# Patient Record
Sex: Female | Born: 1970 | Race: Black or African American | Hispanic: No | Marital: Single | Smoking: Never smoker
Health system: Southern US, Community
[De-identification: ages and names within clinical notes are randomized; demographics above are authoritative.]

## PROBLEM LIST (undated history)

## (undated) ENCOUNTER — Emergency Department (HOSPITAL_COMMUNITY): Admission: EM | Source: Home / Self Care

## (undated) DIAGNOSIS — G40909 Epilepsy, unspecified, not intractable, without status epilepticus: Secondary | ICD-10-CM

## (undated) HISTORY — DX: Epilepsy, unspecified, not intractable, without status epilepticus: G40.909

---

## 2004-10-12 ENCOUNTER — Ambulatory Visit: Payer: Self-pay | Admitting: *Deleted

## 2005-04-19 ENCOUNTER — Other Ambulatory Visit: Admission: RE | Admit: 2005-04-19 | Discharge: 2005-04-19 | Payer: Self-pay | Admitting: Obstetrics and Gynecology

## 2006-02-26 ENCOUNTER — Ambulatory Visit: Payer: Self-pay | Admitting: Internal Medicine

## 2006-04-04 ENCOUNTER — Ambulatory Visit: Payer: Self-pay | Admitting: Internal Medicine

## 2006-06-18 ENCOUNTER — Encounter (INDEPENDENT_AMBULATORY_CARE_PROVIDER_SITE_OTHER): Payer: Self-pay | Admitting: Internal Medicine

## 2006-06-18 ENCOUNTER — Ambulatory Visit: Payer: Self-pay | Admitting: Internal Medicine

## 2006-12-11 ENCOUNTER — Encounter (INDEPENDENT_AMBULATORY_CARE_PROVIDER_SITE_OTHER): Payer: Self-pay | Admitting: *Deleted

## 2006-12-23 ENCOUNTER — Telehealth (INDEPENDENT_AMBULATORY_CARE_PROVIDER_SITE_OTHER): Payer: Self-pay | Admitting: Nurse Practitioner

## 2007-01-30 ENCOUNTER — Encounter (INDEPENDENT_AMBULATORY_CARE_PROVIDER_SITE_OTHER): Payer: Self-pay | Admitting: Internal Medicine

## 2007-02-01 ENCOUNTER — Emergency Department (HOSPITAL_COMMUNITY): Admission: EM | Admit: 2007-02-01 | Discharge: 2007-02-01 | Payer: Self-pay | Admitting: Emergency Medicine

## 2007-03-14 ENCOUNTER — Ambulatory Visit: Payer: Self-pay | Admitting: Internal Medicine

## 2007-03-14 DIAGNOSIS — K5900 Constipation, unspecified: Secondary | ICD-10-CM | POA: Insufficient documentation

## 2007-03-14 DIAGNOSIS — K649 Unspecified hemorrhoids: Secondary | ICD-10-CM | POA: Insufficient documentation

## 2007-03-14 DIAGNOSIS — R569 Unspecified convulsions: Secondary | ICD-10-CM | POA: Insufficient documentation

## 2007-03-14 DIAGNOSIS — N926 Irregular menstruation, unspecified: Secondary | ICD-10-CM | POA: Insufficient documentation

## 2007-03-14 DIAGNOSIS — N939 Abnormal uterine and vaginal bleeding, unspecified: Secondary | ICD-10-CM

## 2007-03-15 ENCOUNTER — Encounter (INDEPENDENT_AMBULATORY_CARE_PROVIDER_SITE_OTHER): Payer: Self-pay | Admitting: Internal Medicine

## 2007-03-16 ENCOUNTER — Encounter (INDEPENDENT_AMBULATORY_CARE_PROVIDER_SITE_OTHER): Payer: Self-pay | Admitting: Internal Medicine

## 2007-06-03 ENCOUNTER — Ambulatory Visit: Payer: Self-pay | Admitting: Internal Medicine

## 2007-06-03 DIAGNOSIS — M719 Bursopathy, unspecified: Secondary | ICD-10-CM

## 2007-06-03 DIAGNOSIS — M67919 Unspecified disorder of synovium and tendon, unspecified shoulder: Secondary | ICD-10-CM | POA: Insufficient documentation

## 2007-06-03 LAB — CONVERTED CEMR LAB: Beta hcg, urine, semiquantitative: POSITIVE

## 2007-06-15 ENCOUNTER — Inpatient Hospital Stay (HOSPITAL_COMMUNITY): Admission: AD | Admit: 2007-06-15 | Discharge: 2007-06-15 | Payer: Self-pay | Admitting: Obstetrics & Gynecology

## 2007-07-19 ENCOUNTER — Inpatient Hospital Stay (HOSPITAL_COMMUNITY): Admission: AD | Admit: 2007-07-19 | Discharge: 2007-07-19 | Payer: Self-pay | Admitting: Obstetrics and Gynecology

## 2007-09-16 ENCOUNTER — Ambulatory Visit: Payer: Self-pay | Admitting: Internal Medicine

## 2007-09-16 ENCOUNTER — Telehealth (INDEPENDENT_AMBULATORY_CARE_PROVIDER_SITE_OTHER): Payer: Self-pay | Admitting: Internal Medicine

## 2007-09-16 DIAGNOSIS — K602 Anal fissure, unspecified: Secondary | ICD-10-CM | POA: Insufficient documentation

## 2007-09-16 DIAGNOSIS — M545 Low back pain, unspecified: Secondary | ICD-10-CM | POA: Insufficient documentation

## 2007-10-23 ENCOUNTER — Encounter (INDEPENDENT_AMBULATORY_CARE_PROVIDER_SITE_OTHER): Payer: Self-pay | Admitting: Internal Medicine

## 2008-07-16 ENCOUNTER — Ambulatory Visit: Payer: Self-pay | Admitting: Internal Medicine

## 2008-07-16 ENCOUNTER — Encounter (INDEPENDENT_AMBULATORY_CARE_PROVIDER_SITE_OTHER): Payer: Self-pay | Admitting: Internal Medicine

## 2008-07-16 DIAGNOSIS — A5901 Trichomonal vulvovaginitis: Secondary | ICD-10-CM | POA: Insufficient documentation

## 2008-07-16 DIAGNOSIS — M25569 Pain in unspecified knee: Secondary | ICD-10-CM | POA: Insufficient documentation

## 2008-07-16 LAB — CONVERTED CEMR LAB
Beta hcg, urine, semiquantitative: NEGATIVE
Bilirubin Urine: NEGATIVE
Blood in Urine, dipstick: NEGATIVE
Glucose, Urine, Semiquant: NEGATIVE
KOH Prep: NEGATIVE
Ketones, urine, test strip: NEGATIVE
Protein, U semiquant: NEGATIVE
WBC Urine, dipstick: NEGATIVE
Whiff Test: POSITIVE

## 2008-07-19 LAB — CONVERTED CEMR LAB
ALT: 18 units/L (ref 0–35)
AST: 18 units/L (ref 0–37)
Albumin: 4.3 g/dL (ref 3.5–5.2)
Alkaline Phosphatase: 57 units/L (ref 39–117)
BUN: 13 mg/dL (ref 6–23)
Basophils Absolute: 0 10*3/uL (ref 0.0–0.1)
Carbamazepine Lvl: 0.3 ug/mL — ABNORMAL LOW (ref 4.0–12.0)
Chlamydia, DNA Probe: NEGATIVE
Chloride: 102 meq/L (ref 96–112)
Eosinophils Relative: 1 % (ref 0–5)
GC Probe Amp, Genital: NEGATIVE
HCT: 39 % (ref 36.0–46.0)
HDL: 59 mg/dL (ref >39)
LDL Cholesterol: 110 mg/dL — ABNORMAL HIGH (ref 0–99)
Lymphocytes Relative: 39 % (ref 12–46)
Lymphs Abs: 2.6 10*3/uL (ref 0.7–4.0)
Neutro Abs: 3.4 10*3/uL (ref 1.7–7.7)
Neutrophils Relative %: 51 % (ref 43–77)
Platelets: 233 10*3/uL (ref 150–400)
Potassium: 4.7 meq/L (ref 3.5–5.3)
RDW: 14.8 % (ref 11.5–15.5)
Total CHOL/HDL Ratio: 3.2
WBC: 6.6 10*3/uL (ref 4.0–10.5)

## 2008-07-27 ENCOUNTER — Ambulatory Visit: Payer: Self-pay | Admitting: Internal Medicine

## 2008-08-02 ENCOUNTER — Ambulatory Visit: Payer: Self-pay | Admitting: Internal Medicine

## 2008-08-13 ENCOUNTER — Emergency Department (HOSPITAL_COMMUNITY): Admission: EM | Admit: 2008-08-13 | Discharge: 2008-08-13 | Payer: Self-pay | Admitting: Emergency Medicine

## 2008-08-16 ENCOUNTER — Telehealth (INDEPENDENT_AMBULATORY_CARE_PROVIDER_SITE_OTHER): Payer: Self-pay | Admitting: Internal Medicine

## 2008-09-13 ENCOUNTER — Ambulatory Visit: Payer: Self-pay | Admitting: Internal Medicine

## 2008-09-14 ENCOUNTER — Telehealth (INDEPENDENT_AMBULATORY_CARE_PROVIDER_SITE_OTHER): Payer: Self-pay | Admitting: Internal Medicine

## 2008-09-20 ENCOUNTER — Telehealth (INDEPENDENT_AMBULATORY_CARE_PROVIDER_SITE_OTHER): Payer: Self-pay | Admitting: Internal Medicine

## 2008-09-28 ENCOUNTER — Telehealth (INDEPENDENT_AMBULATORY_CARE_PROVIDER_SITE_OTHER): Payer: Self-pay | Admitting: Internal Medicine

## 2008-10-01 ENCOUNTER — Ambulatory Visit: Payer: Self-pay | Admitting: Physician Assistant

## 2009-03-28 ENCOUNTER — Telehealth (INDEPENDENT_AMBULATORY_CARE_PROVIDER_SITE_OTHER): Payer: Self-pay | Admitting: Internal Medicine

## 2009-04-21 ENCOUNTER — Telehealth (INDEPENDENT_AMBULATORY_CARE_PROVIDER_SITE_OTHER): Payer: Self-pay | Admitting: Internal Medicine

## 2009-04-21 DIAGNOSIS — Z8719 Personal history of other diseases of the digestive system: Secondary | ICD-10-CM | POA: Insufficient documentation

## 2009-05-11 ENCOUNTER — Ambulatory Visit: Payer: Self-pay | Admitting: Internal Medicine

## 2009-05-30 ENCOUNTER — Encounter (INDEPENDENT_AMBULATORY_CARE_PROVIDER_SITE_OTHER): Payer: Self-pay | Admitting: Internal Medicine

## 2009-07-03 ENCOUNTER — Telehealth (INDEPENDENT_AMBULATORY_CARE_PROVIDER_SITE_OTHER): Payer: Self-pay | Admitting: Internal Medicine

## 2009-07-19 ENCOUNTER — Ambulatory Visit: Payer: Self-pay | Admitting: Internal Medicine

## 2009-07-19 DIAGNOSIS — J309 Allergic rhinitis, unspecified: Secondary | ICD-10-CM | POA: Insufficient documentation

## 2009-07-19 LAB — CONVERTED CEMR LAB
Blood in Urine, dipstick: NEGATIVE
Nitrite: NEGATIVE
Protein, U semiquant: NEGATIVE
Specific Gravity, Urine: 1.015
WBC Urine, dipstick: NEGATIVE

## 2009-07-21 ENCOUNTER — Telehealth (INDEPENDENT_AMBULATORY_CARE_PROVIDER_SITE_OTHER): Payer: Self-pay | Admitting: Internal Medicine

## 2009-07-24 LAB — CONVERTED CEMR LAB
AST: 15 units/L (ref 0–37)
Albumin: 4.3 g/dL (ref 3.5–5.2)
BUN: 14 mg/dL (ref 6–23)
CO2: 23 meq/L (ref 19–32)
Calcium: 9.4 mg/dL (ref 8.4–10.5)
Carbamazepine Lvl: 6.9 ug/mL (ref 4.0–12.0)
Chloride: 102 meq/L (ref 96–112)
Eosinophils Relative: 1 % (ref 0–5)
GC Probe Amp, Genital: NEGATIVE
Glucose, Bld: 84 mg/dL (ref 70–99)
HCT: 40.9 % (ref 36.0–46.0)
Hemoglobin: 13.4 g/dL (ref 12.0–15.0)
Lymphocytes Relative: 45 % (ref 12–46)
Lymphs Abs: 2.9 10*3/uL (ref 0.7–4.0)
Monocytes Absolute: 0.4 10*3/uL (ref 0.1–1.0)
Monocytes Relative: 6 % (ref 3–12)
Pap Smear: NEGATIVE
Potassium: 4.1 meq/L (ref 3.5–5.3)
RBC: 5.05 M/uL (ref 3.87–5.11)
WBC: 6.4 10*3/uL (ref 4.0–10.5)

## 2009-08-03 ENCOUNTER — Telehealth (INDEPENDENT_AMBULATORY_CARE_PROVIDER_SITE_OTHER): Payer: Self-pay | Admitting: Internal Medicine

## 2009-09-09 ENCOUNTER — Ambulatory Visit: Payer: Self-pay | Admitting: Internal Medicine

## 2009-09-09 DIAGNOSIS — N926 Irregular menstruation, unspecified: Secondary | ICD-10-CM | POA: Insufficient documentation

## 2009-09-12 ENCOUNTER — Telehealth (INDEPENDENT_AMBULATORY_CARE_PROVIDER_SITE_OTHER): Payer: Self-pay | Admitting: Internal Medicine

## 2009-09-28 ENCOUNTER — Ambulatory Visit: Payer: Self-pay | Admitting: Internal Medicine

## 2009-09-28 DIAGNOSIS — M722 Plantar fascial fibromatosis: Secondary | ICD-10-CM | POA: Insufficient documentation

## 2009-09-28 DIAGNOSIS — IMO0002 Reserved for concepts with insufficient information to code with codable children: Secondary | ICD-10-CM | POA: Insufficient documentation

## 2009-09-28 DIAGNOSIS — M1710 Unilateral primary osteoarthritis, unspecified knee: Secondary | ICD-10-CM

## 2009-10-20 ENCOUNTER — Ambulatory Visit: Payer: Self-pay | Admitting: Internal Medicine

## 2009-10-20 LAB — CONVERTED CEMR LAB: Beta hcg, urine, semiquantitative: POSITIVE

## 2009-10-21 ENCOUNTER — Encounter (INDEPENDENT_AMBULATORY_CARE_PROVIDER_SITE_OTHER): Payer: Self-pay | Admitting: Internal Medicine

## 2009-11-09 ENCOUNTER — Telehealth (INDEPENDENT_AMBULATORY_CARE_PROVIDER_SITE_OTHER): Payer: Self-pay | Admitting: Internal Medicine

## 2009-11-16 ENCOUNTER — Telehealth (INDEPENDENT_AMBULATORY_CARE_PROVIDER_SITE_OTHER): Payer: Self-pay | Admitting: Nurse Practitioner

## 2009-11-24 ENCOUNTER — Emergency Department (HOSPITAL_COMMUNITY): Admission: EM | Admit: 2009-11-24 | Discharge: 2009-11-24 | Payer: Self-pay | Admitting: Emergency Medicine

## 2009-12-06 ENCOUNTER — Ambulatory Visit (HOSPITAL_COMMUNITY): Admission: RE | Admit: 2009-12-06 | Discharge: 2009-12-06 | Payer: Self-pay | Admitting: Obstetrics & Gynecology

## 2009-12-15 ENCOUNTER — Ambulatory Visit (HOSPITAL_COMMUNITY): Admission: RE | Admit: 2009-12-15 | Discharge: 2009-12-15 | Payer: Self-pay | Admitting: Obstetrics & Gynecology

## 2010-01-03 ENCOUNTER — Telehealth (INDEPENDENT_AMBULATORY_CARE_PROVIDER_SITE_OTHER): Payer: Self-pay | Admitting: Internal Medicine

## 2010-01-04 ENCOUNTER — Encounter (INDEPENDENT_AMBULATORY_CARE_PROVIDER_SITE_OTHER): Payer: Self-pay | Admitting: Internal Medicine

## 2010-01-19 ENCOUNTER — Ambulatory Visit (HOSPITAL_COMMUNITY): Admission: RE | Admit: 2010-01-19 | Discharge: 2010-01-19 | Payer: Self-pay | Admitting: Obstetrics & Gynecology

## 2010-02-15 ENCOUNTER — Ambulatory Visit (HOSPITAL_COMMUNITY)
Admission: RE | Admit: 2010-02-15 | Discharge: 2010-02-15 | Payer: Self-pay | Source: Home / Self Care | Admitting: Obstetrics & Gynecology

## 2010-04-16 ENCOUNTER — Encounter: Payer: Self-pay | Admitting: Obstetrics & Gynecology

## 2010-04-25 NOTE — Assessment & Plan Note (Signed)
Summary: CPP///LEFT FOOT PAIN IN HEEL///KT/KT   Vital Signs:  Patient profile:   40 year old female LMP:     06/30/2009 Weight:      213 pounds BMI:     35.57 Temp:     98.3 degrees F Pulse rate:   67 / minute Pulse rhythm:   regular Resp:     18 per minute BP sitting:   128 / 85  (left arm) Cuff size:   large  Vitals Entered By: Vesta Mixer CMA (July 19, 2009 12:07 PM) CC: CPP and some chest congestion Is Patient Diabetic? No Pain Assessment Patient in pain? no       Does patient need assistance? Ambulation Normal LMP (date): 06/30/2009     Enter LMP: 06/30/2009 Last PAP Result NEGATIVE FOR INTRAEPITHELIAL LESIONS OR MALIGNANCY.   Primary Care Provider:  Julieanne Manson MD  CC:  CPP and some chest congestion.  History of Present Illness: 40 yo female here for CPP.   Concerns:  1. Congestion:  for about 1 month--feels it started with chest tightness and eventually cough productive of cream colored mucous.  Now up in face now and into ears--the latter about 1 week ago.  Symptoms wax and wane.  Taking Benadryl and Robitussin CF--help for a little bit.  Not clear if has had problems similar to this in spring before.  Sneezing a lot.  Copious clear nasal discharge.  No itchy throat.  No eye symptoms.  2.  Rectal bleeding:  resolved with treatment of constipation and hemorrhoids.  Taking Miralax regularly every other day and eating better.  Did send in Guaiac cards back in March or earlier, but do not see results.  Father diagnosed at 62 with colon cancer.  She does not have melena or hematochezia.  Habits & Providers  Alcohol-Tobacco-Diet     Alcohol drinks/day: 0     Tobacco Status: never  Exercise-Depression-Behavior     Drug Use: never  Allergies (verified): No Known Drug Allergies  Past History:  Past Medical History: Reviewed history from 07/16/2008 and no changes required. 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 1.  teenager:  Cervical conization.  Family History: Mother, 7:  Healthy Father, 64:  Colon Cancer--age 69 2 Brothers, 69 and 48:  Healthy Son, 16:  Healthy MGM died with colon CA in her 34's; mother has h/o benign polyps  Social History: Drug Use:  never  Review of Systems General:  Energy good. Eyes:  Denies blurring. ENT:  Denies decreased hearing. CV:  Denies chest pain or discomfort. Resp:  See HPI. GI:  Denies abdominal pain, bloody stools, constipation, dark tarry stools, and diarrhea. GU:  Denies discharge, dysuria, and urinary frequency. MS:  Denies joint pain, joint redness, and joint swelling. Derm:  Mole on left neck--loose.Marland Kitchen Neuro:  Denies numbness, tingling, and weakness. Psych:  Denies anxiety, depression, and suicidal thoughts/plans.  Physical Exam  General:  Well-developed,well-nourished,in no acute distress; alert,appropriate and cooperative throughout examination Head:  Normocephalic and atraumatic without obvious abnormalities. No apparent alopecia or balding. Eyes:  No corneal or conjunctival inflammation noted. EOMI. Perrla. Funduscopic exam benign, without hemorrhages, exudates or papilledema. Vision grossly normal. Ears:  External ear exam shows no significant lesions or deformities.  Otoscopic examination reveals clear canals, tympanic membranes are intact bilaterally  without bulging, retraction, inflammation or discharge. Hearing is grossly normal bilaterally. Nose:  nasal dischargemucosal pallor and mucosal edema.   Mouth:  Oral mucosa and oropharynx without lesions or exudates.  Teeth in good repair. Neck:  No deformities, masses, or tenderness noted. Breasts:  No mass,  nodules, thickening, tenderness, bulging, retraction, inflamation, nipple discharge or skin changes noted.   Lungs:  Normal respiratory effort, chest expands symmetrically. Lungs are clear to auscultation, no crackles or wheezes. Heart:  Normal rate and regular rhythm. S1 and S2 normal without gallop, murmur, click, rub or other extra sounds. Abdomen:  Bowel sounds positive,abdomen soft and non-tender without masses, organomegaly or hernias noted. Rectal:  No external abnormalities noted. Normal sphincter tone. No rectal masses or tenderness.  Heme negative light brown stool.  No active hemorrhoids Genitalia:  Pelvic Exam:        External: normal female genitalia without lesions or masses        Vagina: normal without lesions or masses--moderate white discharge without mucosal erythema        Cervix: normal without lesions or masses        Adnexa: normal bimanual exam without masses or fullness        Uterus: normal by palpation        Pap smear: performed Msk:  No deformity or scoliosis noted of thoracic or lumbar spine.   Pulses:  R and L carotid,radial,femoral,dorsalis pedis and posterior tibial pulses are full and equal bilaterally Extremities:  No clubbing, cyanosis, edema, or deformity noted with normal full range of motion of all joints.   Neurologic:  No cranial nerve deficits noted. Station and gait are normal. Plantar reflexes are down-going bilaterally. DTRs are symmetrical throughout. Sensory, motor and coordinative functions appear intact. Skin:  Intact without suspicious lesions or rashes Cervical Nodes:  No lymphadenopathy noted Axillary Nodes:  No palpable lymphadenopathy Inguinal Nodes:  No significant adenopathy Psych:  Cognition and judgment appear intact. Alert and cooperative with normal attention span and concentration. No apparent delusions, illusions, hallucinations   Impression & Recommendations:  Problem # 1:  ROUTINE GYNECOLOGICAL EXAMINATION (ICD-V72.31) Await  pap--not sure if yeast or not based on wet prep--will treat if positive on pap Orders: KOH/ WET Mount [phone]) UA Dipstick w/o Micro (manual) (98119) Pap Smear, Thin Prep ( Collection of) [phone]) T- GC Chlamydia ([address]) T-HIV Antibody  (Reflex) ([id number]) T-Pap Smear, Thin Prep (62952) T-Syphilis Test (RPR) (84132-44010)  Problem # 2:  HEALTH MAINTENANCE EXAM (ICD-V70.0) Still balking on getting Tdap--wants to check mother and records regarding this.  Problem # 3:  RECTAL BLEEDING, HX OF (ICD-V12.79) Resolved. Pt. given hemoccult x 3 to return in 2 weeks --unable to find results of previous cards.  Problem # 4:  ALLERGIC RHINITIS (ICD-477.9) Start Xyzal and Fluticasone. Her updated medication list for this problem includes:    Xyzal 5 Mg Tabs (Levocetirizine dihydrochloride) .Marland Kitchen... 1 tab by mouth daily for allergies    Fluticasone Propionate 50 Mcg/act Susp (Fluticasone propionate) .Marland Kitchen... 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 2 tabs by mouth once daily  Orders: T-Tegretol (Carbamazepine) ([id number])  Complete Medication List: 1)  Tegretol Xr 200 Mg Tb12 (Carbamazepine) .... 2 tabs by mouth once daily 2)  Anusol-hc 25 Mg Supp (Hydrocortisone acetate) .Marland Kitchen.. 1 pr three times a day as needed irritation and bleeding 3)  Fluconazole  150 Mg Tabs (Fluconazole) .Marland Kitchen.. 1 tab by mouth for 1 dose 4)  Xyzal 5 Mg Tabs (Levocetirizine dihydrochloride) .Marland Kitchen.. 1 tab by mouth daily for allergies 5)  Fluticasone Propionate 50 Mcg/act Susp (Fluticasone propionate) .... 2 sprays each nostril daily  Other Orders: T-Comprehensive Metabolic Panel ([id number]) T-CBC w/Diff (09811-91478)   Preventive Care Screening  Prior Values:    Pap Smear:  NEGATIVE FOR INTRAEPITHELIAL LESIONS OR MALIGNANCY. (07/16/2008)     Mammogram:  never. LMP:   06/30/09--no problems SBE:  MOnthly--no changes Immunizations:  still has not checked with mother regarding tetanus status--refuses immunization today. Osteoprevention:  4 servings milk daily.  Exercising daily on treadmill.   Laboratory Results   Urine Tests    Routine Urinalysis   Glucose: negative   (Normal Range: Negative) Bilirubin: negative   (Normal Range: Negative) Ketone: negative   (Normal Range: Negative) Spec. Gravity: 1.015   (Normal Range: 1.003-1.035) Blood: negative   (Normal Range: Negative) pH: 5.0   (Normal Range: 5.0-8.0) Protein: negative   (Normal Range: Negative) Urobilinogen: 0.2   (Normal Range: 0-1) Nitrite: negative   (Normal Range: Negative) Leukocyte Esterace: negative   (Normal Range: Negative)    Date/Time Received: July 19, 2009 1:39 PM  Date/Time Reported: July 19, 2009 1:39 PM   [employer] Source: vaginal WBC/hpf: 1-5 Bacteria/hpf: 1+ Clue cells/hpf: few  Negative whiff Yeast/hpf: few--? pseudophyphae Wet Mount KOH: Negative Trichomonas/hpf: none  Other Tests  Rapid HIV: negative      Laboratory Results   Urine Tests    Routine Urinalysis   Glucose: negative   (Normal Range: Negative) Bilirubin: negative   (Normal Range: Negative) Ketone: negative   (Normal Range: Negative) Spec. Gravity: 1.015   (Normal Range: 1.003-1.035) Blood: negative   (Normal Range: Negative) pH: 5.0   (Normal Range: 5.0-8.0) Protein: negative   (Normal Range: Negative) Urobilinogen: 0.2   (Normal Range: 0-1) Nitrite: negative   (Normal Range: Negative) Leukocyte Esterace: negative   (Normal Range: Negative)      Wet Mount/KOH  Negative whiff  Other Tests  Rapid HIV: negative

## 2010-04-25 NOTE — Progress Notes (Signed)
Summary: test results   Phone Note Call from Patient   Summary of Call: PLEASE GIVE PT. A CALL BACK BEFORE 11:00 TODAY FOR TEST RESULTS//SHE WANTS TO TALT TO YOU BEFORE SHE GOES TO WORK //VERY CONCERNED THAT SHE DID NOT HAVE A CYCLE//CALL BACK [phone] Initial call taken by: Arta Bruce,  September 12, 2009 9:34 AM  Follow-up for Phone Call        Results are back will send to Dr. Delrae Alfred for review. Follow-up by: Vesta Mixer CMA,  September 12, 2009 10:37 AM  Additional Follow-up for Phone Call Additional follow up Details #1::        Per Dr. Delrae Alfred ok to notify pt that labs are normal.  Pt aware and will let us know if she does not get a cycle next month. Additional Follow-up by: Vesta Mixer CMA,  September 12, 2009 2:41 PM

## 2010-04-25 NOTE — Progress Notes (Signed)
Summary: MEDS WERE NOT AT [address] [facility]  Medications Added FLUCONAZOLE 150 MG TABS (FLUCONAZOLE) 1 tab by mouth for 1 dose XYZAL 5 MG TABS (LEVOCETIRIZINE DIHYDROCHLORIDE) 1 tab by mouth daily for allergies FLUTICASONE PROPIONATE 50 MCG/ACT SUSP (FLUTICASONE PROPIONATE) 2 sprays each nostril daily       Phone Note Call from Patient Call back at Home Phone [phone]   Reason for Call: Refill Medication Summary of Call: Bridget Trujillo PT. [facility] CALLED AND SAYS THAT SHE NEEDS HER TEGRATOL REFILLED AT [address] SHE SAYS THAT SHE HAS 1 REFILL ON IT, SHE SAYS THAT SHE ONLY HAS 2 PILLS LEFT AND SHE'LL NEED HER SEIZURE  MEDS BEFORE THE WEEKEND, ALSO ON HER VISIT DR Michall Noffke WAS GOING TO ALSO CALL IN HER ANUSOL AND SINGULAIR TO [facility].  Initial call taken by: Leodis Rains,  July 21, 2009 9:18 AM  Follow-up for Phone Call        Fwd to pcp for refill auth. Follow-up by: Vesta Mixer CMA,  July 21, 2009 10:29 AM  Additional Follow-up for Phone Call Additional follow up Details #1::        PATIENT CALLED AGAIN TO FOLLOW UP ON HER MEDS. Additional Follow-up by: Leodis Rains,  July 21, 2009 12:27 PM    Additional Follow-up for Phone Call Additional follow up Details #2::    MOTHER CALLED AGAIN LAST 2 PILLS  NEED TO PICK UP TOMORROW Follow-up by: Arta Bruce,  July 21, 2009 3:37 PM  Additional Follow-up for Phone Call Additional follow up Details #3:: Details for Additional Follow-up Action Taken: Pt. still with refill left at [facility] pharmacy--not sure if pt. did not call ahead to get filled and that was the problem or not--I called and left message--will try to call again today.  Julieanne Manson MD  Jul 24, 2009 12:55 PM  Able to contact pt. later--she has enough of Tegretol until next Thursday.  For some reason, pharmacy would not fill med even though had one refill left--has already been faxed to pharmacy--will need to contact Burna Mortimer as to why would not fill--ICP.     note faxed regarding this with refills Julieanne Manson MD  Jul 24, 2009 2:48 PM   New/Updated Medications: FLUCONAZOLE 150 MG TABS (FLUCONAZOLE) 1 tab by mouth for 1 dose XYZAL 5 MG TABS (LEVOCETIRIZINE DIHYDROCHLORIDE) 1 tab by mouth daily for allergies FLUTICASONE PROPIONATE 50 MCG/ACT SUSP (FLUTICASONE PROPIONATE) 2 sprays each nostril daily Prescriptions: ANUSOL-HC 25 MG  SUPP (HYDROCORTISONE ACETATE) 1 pr three times a day as needed irritation and bleeding  #24 x 1   Entered and Authorized by:   Julieanne Manson MD   Signed by:   Julieanne Manson MD on 07/24/2009   Method used:   Faxed to ...       [facility] - [facility] (retail)       [address] [place], [place]  [address]       Ph: [phone]       Fax: [phone]   RxID:   [id number] FLUTICASONE PROPIONATE 50 MCG/ACT SUSP (FLUTICASONE PROPIONATE) 2 sprays each nostril daily  #1 x 11   Entered and Authorized by:   Julieanne Manson MD   Signed by:   Julieanne Manson MD on 07/24/2009   Method used:   Faxed to ...       [facility] - [facility] (retail)       [address]  [place], [place]  [address]       Ph: [phone]       Fax: [phone]   RxID:   [id number] XYZAL 5 MG TABS (LEVOCETIRIZINE DIHYDROCHLORIDE) 1 tab by mouth daily for allergies  #30 x 11   Entered and Authorized by:   Julieanne Manson MD   Signed by:   Julieanne Manson MD on 07/24/2009   Method used:   Faxed to ...       [facility] - Pharmac (retail)       [address] [place], [place]  [address]       Ph: [phone]       Fax: [phone]   RxID:   [id number] FLUCONAZOLE 150 MG TABS (FLUCONAZOLE) 1 tab by mouth for 1 dose  #1 x 0   Entered and Authorized by:   Julieanne Manson MD   Signed by:   Julieanne Manson MD on 07/24/2009   Method used:   Faxed to ...       [facility] -  Pharmac (retail)       [address] [place], [place]  [address]       Ph: [phone]       Fax: [phone]   RxID:   [id number] TEGRETOL XR 200 MG  TB12 (CARBAMAZEPINE) 2 tabs by mouth once daily  #60 x 11   Entered and Authorized by:   Julieanne Manson MD   Signed by:   Julieanne Manson MD on 07/24/2009   Method used:   Faxed to ...       [facility] - Pharmac (retail)       [address] [place], [place]  [address]       Ph: [phone]       Fax: [phone]   RxID:   [id number]

## 2010-04-25 NOTE — Assessment & Plan Note (Signed)
Summary: LEFT FOOT PAIN////KT   Vital Signs:  Patient profile:   40 year old female Menstrual status:  regular Weight:      217 pounds Temp:     97.6 degrees F Pulse rate:   72 / minute Pulse rhythm:   regular Resp:     20 per minute BP sitting:   116 / 79  (left arm) Cuff size:   large  Vitals Entered By: Vesta Mixer CMA (September 28, 2009 1:11 PM) CC: left heel pain about 2 months, worse in the morning or if getting after sitting for a while, ok once she has been up for a while. Has tried rolling foot on frozen bottle of water, but it didn't help.  Bilat knee pain Is Patient Diabetic? No Pain Assessment Patient in pain? yes     Location: left foot Intensity: 9  Does patient need assistance? Ambulation Normal   Primary Care Provider:  Julieanne Manson MD  CC:  left heel pain about 2 months, worse in the morning or if getting after sitting for a while, ok once she has been up for a while. Has tried rolling foot on frozen bottle of water, and but it didn't help.  Bilat knee pain.  History of Present Illness: 1.  Left heel pain for 2 months:  Pain worse as above when in morning.  Bottom of heels hurts--sometimes comes up sides of leg.  Taking Motrin 800 mg and not really helping.  On hard floor most of day at [employer].  Works at [employer].    2.  Bilateral knee pain:  generally when on feet for a long time--after work.    Allergies (verified): No Known Drug Allergies  Physical Exam  Extremities:  Bilateral knees with hypertrophic changes.  Full ROM.  NT.  No effusion  Left heel with point tenderness on medial plantar surface.  No erythema or swelling.  Full ROM of ankle and foot.   Impression & Recommendations:  Problem # 1:  PLANTAR FASCIITIS, LEFT (ICD-728.71)  Discussed heel cups for both shoes  Stretching two times a day  Her updated medication list for this problem includes:    Diclofenac Sodium 75 Mg Tbec (Diclofenac sodium) .Marland Kitchen... 1 tab by mouth two  times a day with food  Orders: Physical Therapy Referral (PT)  Problem # 2:  OSTEOARTHRITIS, KNEES, BILATERAL (ICD-715.96)  Her updated medication list for this problem includes:    Diclofenac Sodium 75 Mg Tbec (Diclofenac sodium) .Marland Kitchen... 1 tab by mouth two times a day with food  Complete Medication List: 1)  Tegretol Xr 200 Mg Tb12 (Carbamazepine) .... 2 tabs by mouth once daily 2)  Anusol-hc 25 Mg Supp (Hydrocortisone acetate) .Marland Kitchen.. 1 pr three times a day as needed irritation and bleeding 3)  Fluconazole 150 Mg Tabs (Fluconazole) .Marland Kitchen.. 1 tab by mouth for 1 dose 4)  Xyzal 5 Mg Tabs (Levocetirizine dihydrochloride) .Marland Kitchen.. 1 tab by mouth daily for allergies 5)  Fluticasone Propionate 50 Mcg/act Susp (Fluticasone propionate) .... 2 sprays each nostril daily 6)  Diclofenac Sodium 75 Mg Tbec (Diclofenac sodium) .Marland Kitchen.. 1 tab by mouth two times a day with food  Patient Instructions: 1)  Get heel cups for both feet and wear in every pair of shoes. 2)  Follow up with Dr. Delrae Alfred in 2 months  3)  Call if you do not hear from PT in 2 weeks. Prescriptions: DICLOFENAC SODIUM 75 MG TBEC (DICLOFENAC SODIUM) 1 tab by mouth two times  a day with food  #60 x 1   Entered and Authorized by:   Julieanne Manson MD   Signed by:   Julieanne Manson MD on 09/28/2009   Method used:   Electronically to        [facility] [phone]* (retail)       [address]       [place], [place]  [address]       Ph: [phone]       Fax: [phone]   RxID:   [id number]

## 2010-04-25 NOTE — Progress Notes (Signed)
Summary: med ?   Phone Note Call from Patient   Summary of Call: Pt calling she does not see the pregnancy nurse until 10/11 and wants to know if tegretol dose can be lowered or chaged to something that is more compatible with pregancy.  She was told the tegreol was a catogory B.   [facility].  Pt can be reached at [phone].  Per pt ok to leave detailed voicemail if she does not answer. Initial call taken by: Vesta Mixer CMA,  November 16, 2009 11:44 AM  Follow-up for Phone Call        Spoke with pt - she actually has an appt with GYN on tomorrow. She has been taking tegretol daily advised pt that since she was able to get an earlier appt at GYN then go there tomorrow and update this office with any med changes. She will f/u with GYN while pregnant  Follow-up by: Lehman Prom FNP,  November 16, 2009 5:41 PM     Appended Document: med ? Please make sure she stops the Diclofenac as well.  Appended Document: med ? LMOM {USER.REALNAME}  {DATETIMESTAMP()}   Appended Document: med ? Pt aware. She is not even taking this med,Diclofenac. She was advised by her OBGYN to continute Tegretol and add Folic Acid. advised pt to follow all instructions given from [facility] and follow up with  office after delivery

## 2010-04-25 NOTE — Assessment & Plan Note (Signed)
Summary: Irregular menses   Vital Signs:  Patient profile:   40 year old female Menstrual status:  regular LMP:     07/26/2009 Weight:      215.31 pounds Temp:     97.8 degrees F oral Pulse rate:   73 / minute Pulse rhythm:   regular Resp:     20 per minute BP sitting:   122 / 86  (right arm)  Vitals Entered By: Dutch Quint RN (September 09, 2009 8:41 AM) CC: missed period Is Patient Diabetic? No Pain Assessment Patient in pain? no       Does patient need assistance? Functional Status Self care Ambulation Normal Comments feling abdominal cramping, "like my period is coming on." LMP (date): 07/26/2009 LMP - Character: normal LMP - Reliable? Yes     Menstrual Status regular Enter LMP: 07/26/2009 Last PAP Result NEGATIVE FOR INTRAEPITHELIAL LESIONS OR MALIGNANCY.   Primary Care Provider:  Julieanne Manson MD  CC:  missed period.  History of Present Illness:  Pt into the office with complaints of a missed period LMP 07/25/2009 She is very regular - usually every month Cramping around the time when menses would start but no flow Denies any nausea, vomiting, breast tenderness Pregnancy x 2 negative at home  Exercise - Pt has started to increase her exercise as she has a workout gym in her townhouse complex Pt has also started doing herbal life in attempt to lose weight.  Social - Currently in a relationship - she would like another child but did not "plan" to get pregnant now but she is not on birth control to prevent it  12 year old son is her only child  Habits & Providers  Alcohol-Tobacco-Diet     Alcohol drinks/day: 0     Tobacco Status: never  Exercise-Depression-Behavior     Does Patient Exercise: yes     Exercise Counseling: not indicated; exercise is adequate     Drug Use: never  Allergies: No Known Drug Allergies  Review of Systems General:  Complains of fatigue. CV:  Denies chest pain or discomfort. Resp:  Denies cough. GI:  Denies abdominal  pain, nausea, and vomiting. Laboratory Results   Urine Tests  Date/Time Received: September 09, 2009 9:00 AM     Urine HCG: negative    Physical Exam  General:  alert.   Head:  normocephalic.   Lungs:  normal breath sounds.   Heart:  normal rate and regular rhythm.   Abdomen:  normal bowel sounds.   Msk:  up to the exam table Neurologic:  alert & oriented X3.     Impression & Recommendations:  Problem # 1:  IRREGULAR MENSES (ICD-626.4) Assessment Improved urine pregnancy negative today will check serum today advised pt on reasons for irregular  Orders: Urine Pregnancy Test  (528[address]) T-TSH [phone]) T-Pregnancy (Serum), Qual.  ([address]64-[address])  Complete Medication List: 1)  Tegretol Xr 200 Mg Tb12 (Carbamazepine) .... 2 tabs by mouth once daily 2)  Anusol-hc 25 Mg Supp (Hydrocortisone acetate) .Marland Kitchen.. 1 pr three times a day as needed irritation and bleeding 3)  Fluconazole 150 Mg Tabs (Fluconazole) .Marland Kitchen.. 1 tab by mouth for 1 dose 4)  Xyzal 5 Mg Tabs (Levocetirizine dihydrochloride) .Marland Kitchen.. 1 tab by mouth daily for allergies 5)  Fluticasone Propionate 50 Mcg/act Susp (Fluticasone propionate) .... 2 sprays each nostril daily  Patient Instructions: 1)  Urine pregnancy is negative today. 2)  Will check serum blood to verify. 3)  Will also check your thyroid  lab 4)  Other causes for mixed periods are changes in your routine such as exercise (things that speed up your metabolism) 5)  Follow up as needed  Prevention & Chronic Care Immunizations   Influenza vaccine: Not documented    Tetanus booster: Not documented    Pneumococcal vaccine: Not documented  Other Screening   Pap smear: NEGATIVE FOR INTRAEPITHELIAL LESIONS OR MALIGNANCY.  (07/19/2009)   Smoking status: never  (09/09/2009)  Lipids   Total Cholesterol: 189  (07/16/2008)   LDL: 110  (07/16/2008)   LDL Direct: Not documented   HDL: 59  (07/16/2008)   Triglycerides: 100  (07/16/2008)

## 2010-04-25 NOTE — Progress Notes (Signed)
Summary: WANTS REF FOR COLONOSCOPY   Phone Note Call from Patient Call back at Home Phone [phone]   Reason for Call: Referral Summary of Call: Bridget Trujillo PT. MS Rossy CALLED AND WANTS TO KNOW IF WE CAN GO AHEAD AND SET HER UP TO HAVE HER COLONOSCOPY. SHE HAD PUT IT OFF AT THE TIME, AND NOW SINCE HER FATHER IS ACTUALLY GOING THRU CHEMO FOR COLON CANCER AND NOW SHE IS CONCERNED AND SHE KNOW THAT SHE HAS HAD SOME RECTUAL BLEEDING. Initial call taken by: Bridget Trujillo,  April 21, 2009 4:09 PM  Follow-up for Phone Call        line busy..........Marland KitchenMikey Trujillo Trujillo  April 21, 2009 4:26 PM   Additional Follow-up for Phone Call Additional follow up Details #1::        Is she still having constipation? How old is her father?  I have him as being 67.  When was he diagnosed--at what age? Additional Follow-up by: Bridget Manson MD,  April 25, 2009 2:08 PM  New Problems: RECTAL BLEEDING, HX OF (ICD-V12.79)   Additional Follow-up for Phone Call Additional follow up Details #2::    Called pt someone answered, but did not say anything then hung up.  Called back and call went to voicemail.  Left msg for pt to call. Follow-up by: Bridget Trujillo Trujillo,  April 25, 2009 4:07 PM  Additional Follow-up for Phone Call Additional follow up Details #3:: Details for Additional Follow-up Action Taken: Spoke with pt she said the constipation is not as bad because she has been using the Miralax.  She has not had to take it as much because she changed her diet also.  She is not having any bleeding now.  Her father was just diagnosed now at age 34 and is doing chemo now............ Bridget Trujillo  April 29, 2009 4:43 PM  Have her come in and pick up a guaiac card packet.  Usually, we start screening colonoscopies in family members 10 year ealier then the age of what their family members are at time of diagnosis.  For her this would be after 50, but we start everyone at 50.  If she feels too anxious to  wait until then, she needs to let us know--I would first like her to perform the cards while her constipation and hemorrhoids are not a problem, however.  Would also like her to come in for a CBC in next 2 weeks--please schedule.  Dx of rectal bleeding for CBC     pt notified CBC scheduled will give stool cards when she comes in for lab. Bridget Cheers RN  May 03, 2009 10:11 AM    Bridget Manson MD  May 03, 2009 9:17 AM n Additional Follow-up by: Bridget Cheers RN,  May 03, 2009 10:11 AM  New Problems: RECTAL BLEEDING, HX OF (ICD-V12.79)     Family History: Mother, 63:  Healthy Father, 86:  Colon Cancer--age 8 2 Brothers, 72 and 110:  Healthy Son, 15:  Healthy MGM died with colon CA in her 7's; mother has h/o benign polyps   Appended Document: Hemoccult Results     Allergies: No Known Drug Allergies   Complete Medication List: 1)  Tegretol Xr 200 Mg Tb12 (Carbamazepine) .... 2 tabs by mouth once daily 2)  Anusol-hc 25 Mg Supp (Hydrocortisone acetate) .Marland Kitchen.. 1 pr three times a day as needed irritation and bleeding 3)  Metronidazole 500 Mg Tabs (Metronidazole) .... 4 tabs by mouth for one  dose   Laboratory Results    Stool - Occult Blood Hemmoccult #1: negative Date: 05/20/2009 Hemoccult #2: negative Date: 05/20/2009 Hemoccult #3: negative Date: 05/20/2009

## 2010-04-25 NOTE — Progress Notes (Signed)
Summary: DRY COUGH/ REQUESTING COUGH MED   Phone Note Call from Patient Call back at Home Phone [phone]   Reason for Call: Acute Illness, Refill Medication Summary of Call: Lanessa Shill PT. MS Sc[place]in CALLED, BECAUSE SHE WAS SUPOSE TO HAVE CALLED LAST WEEK TO LET DR Muhammed Teutsch KNOW THAT SHE GOT ALL HER PERSCRIPTIONS, BUT SHE IS ASKING IF YOU CAN CALL IN SOMETHING FOR THE DRY COUGH THAT SHE HAS. SHE HAS TRIED OTC MEDS AND NOTHING IS HELPING AND SHE HAS COUGHED SO MUCH, THAT HER CHEST HURTS.  PATIENT USES [facility]. Initial call taken by: Leodis Rains,  Aug 03, 2009 12:03 PM  Follow-up for Phone Call        Spoke with pt she says she has a dry cough at night, it keeps her up all night, she has taken otc robitussin and cloricidan and nothing is help.  NKDA [employer]   Ok to leave msg on voice mail per pt. Follow-up by: Vesta Mixer CMA,  Aug 03, 2009 5:33 PM  Additional Follow-up for Phone Call Additional follow up Details #1::        Did she get started on Xyzal and nasal fluticasone?  I felt she had allergies at her CPP. Additional Follow-up by: Julieanne Manson MD,  Aug 09, 2009 7:36 AM    Additional Follow-up for Phone Call Additional follow up Details #2::    Left message for return call on voicemail. Dutch Quint RN  Aug 10, 2009 12:31 PM  Spoke with patient.  States that she has been taking Xyzal and fluticasone.  States she has been improving, did not cough at all last night.  Advised to drink plenty of fluids, keep head elevated at night, use Robitussin DM at night if needed.   Follow-up by: Dutch Quint RN,  Aug 10, 2009 2:56 PM

## 2010-04-25 NOTE — Progress Notes (Signed)
   Phone Note Outgoing Call   Summary of Call: I am unable to find her repeat CBC in her chart--apparently done in February.  Needs those results.  Would like pt. to come in to discuss the rectal bleeding and see where she is with that, her constipation.   All of he labs and follow up have been fine, but want to see her for this once more. Initial call taken by: Julieanne Manson MD,  July 03, 2009 6:01 PM  Follow-up for Phone Call        Will check on CBC, pt is scheduled for CPP next week. Follow-up by: Vesta Mixer CMA,  July 12, 2009 11:21 AM

## 2010-04-25 NOTE — Letter (Signed)
Summary: Generic Letter  [facility]  [address] [place], [place] [address]   Phone: [phone]  Fax: [phone]    10/21/2009  [facility].  Re:  Bridget Trujillo      [address]      [place], [place]  [address]  Based on date of 1st day of last period:  September 15, 2009 Ms.  Labrada's EDC is 06/24/10         Sincerely,   Julieanne Manson MD

## 2010-04-25 NOTE — Progress Notes (Signed)
Summary: Office Visit//DEPRESSION SCREENING  Office Visit//DEPRESSION SCREENING   Imported By: Arta Bruce 09/13/2009 15:41:11  _____________________________________________________________________  External Attachment:    Type:   Image     Comment:   External Document

## 2010-04-25 NOTE — Letter (Signed)
Summary: WORK/SCHOOL EXCUSE  WORK/SCHOOL EXCUSE   Imported By: Arta Bruce 07/27/2009 11:41:38  _____________________________________________________________________  External Attachment:    Type:   Image     Comment:   External Document

## 2010-04-25 NOTE — Progress Notes (Signed)
Summary: NEEDS RX FOR TEGRETOL  Phone Note Call from Patient Call back at Home Phone [phone]   Reason for Call: Refill Medication Summary of Call: MULBERRY PT. MS Egner DROPPED BY TO SEE IF YOU CAN GIVE HER A RX FOR HER TEGRETOL OR SEND IT INTO [facility], UNTIL SHE GET IT FROM THE PHARMACUTICAL COM, WHICH WILL BE IN ABOUT 4 WEEKS. SHE SAYS THAT SHE ONLY HAS 5 PILLS LEFT AND SHE DOESN;T WANT TO BE WITHOUT THEM. SHE ALSO BROUGHT IN THE FORM TO BE SIGNED FROM [facility] AND WANDA SAYS IF YOU SIGN IT, THEN [facility] [place] CAN GO AHEAD AND SEND IT IN. Initial call taken by: Leodis Rains,  January 03, 2010 10:50 AM  Follow-up for Phone Call        Sent to [facility].  Dutch Quint RN  January 03, 2010 3:27 PM   Additional Follow-up for Phone Call Additional follow up Details #1::        Wait--need to clarify before picks up form--I had the same form in my [facility] med packet--please find out why pt. is bringing me one as well. Let her know the Rx was sent to [facility] Additional Follow-up by: Julieanne Manson MD,  January 03, 2010 6:20 PM    Additional Follow-up for Phone Call Additional follow up Details #2::    BECAUSE SHE WAS SENT A FORM AT HOME ALSO. AND I SPOKE W/WANDA IN THE PHARM. AND SAYS THAT IF YOU SIGN THE ONE SHE BROUGHT IN, I CAN SEND IT TO W[facility].   RX WAS FAXED.  I believe I filled out the one in the packet from [facility] check with her to make sure she got it.  If so, destroy the one the patient brought in please  Julieanne Manson MD  January 05, 2010 6:57 PM  Follow-up by: Leodis Rains,  January 04, 2010 8:51 AM  Additional Follow-up for Phone Call Additional follow up Details #3:: Details for Additional Follow-up Action Taken: FORM HAS BEEN DONE AND RX PICKED UP. Additional Follow-up by: Leodis Rains,  January 06, 2010 3:14 PM  Prescriptions: TEGRETOL XR 200 MG  TB12 (CARBAMAZEPINE) 2 tabs by mouth once daily  #60 x 1   Entered and Authorized by:   Julieanne Manson MD   Signed by:   Julieanne Manson MD on 01/03/2010   Method used:   Electronically to        [facility]* (retail)       [address] [place], [place]  [address]       Ph: [phone]       Fax: [phone]   RxID:   [id number] TEGRETOL XR 200 MG  TB12 (CARBAMAZEPINE) 2 tabs by mouth once daily  #60 x 11   Entered and Authorized by:   Julieanne Manson MD   Signed by:   Julieanne Manson MD on 01/03/2010   Method used:   Print then Give to Patient   RxID:   [id number]  1st for [employer] 2nd for pt.

## 2010-04-25 NOTE — Progress Notes (Signed)
Summary: BAD SINUS INFECTION   Phone Note Call from Patient Call back at Home Phone [phone]   Summary of Call: Anoushka Divito PT. MS Mi CALLED AND SAYS THAT SHE HAS A SINUS INFECTION AND IS REAL CONGESTED. AND SHE SHE WANTS TO KNOW IF YOU CAN CALL SOMETHING INTO [facility]. Initial call taken by: Leodis Rains,  March 28, 2009 4:35 PM  Follow-up for Phone Call        The pt called back again in reference her bad congestion and she is wondering if the provider can call in something for her or if the provider prefer to see her.   Please call her back.Manon Hilding  March 29, 2009 2:47 PM  Additional Follow-up for Phone Call Additional follow up Details #1::        Left message on answering machine for pt to return call at [phone]. Additional Follow-up by: Vesta Mixer CMA,  March 31, 2009 3:56 PM    Additional Follow-up for Phone Call Additional follow up Details #2::    Pt taking mucinex,  nasal spray by vicks not sure the name and it helps some, but as night comes she gets a lot of sinusl pressure, dry cough at night she is taking robitussin dm for that, no fever.  This started the beginning of last week.  She feels like it is clearing up some, but is still having a lot of pressure.  She is drinking lots of fluids NKDA. [facility] pharmacy.  I did tell her if something was called in she would in she would not be able to get it until next week and she was fine with that.  Ok to leave detailed msg on voice mail if she does not answer. [phone] Follow-up by: Vesta Mixer CMA,  April 01, 2009 4:37 PM  Additional Follow-up for Phone Call Additional follow up Details #3:: Details for Additional Follow-up Action Taken: Called: Nasal spray is another version of Oxymetolazone--asked her to stop--only used 2 days.  Recommended a decongestant--maybe trying Nyquil at bedtime.  cool mist humidifier in bedroom at night.  Drink lots of fluids.  Out and about currently.  She feels she is  gradually improving.   Additional Follow-up by: Julieanne Manson MD,  April 01, 2009 6:39 PM

## 2010-04-25 NOTE — Progress Notes (Signed)
Summary: PT REFERRAL   Phone Note Outgoing Call   Summary of Call: I just want to let you know that your pt no show to her appt 10-17-09 @ 3:45 physical therapy and I talk to her on 11-02-09 @ 5:55pm  she said that she haven't chance to do it because of family issues and i offer myself to schedule a new appt and she said she will do it and I check if she schedule an appt she haven't done it  Initial call taken by: Cheryll Dessert,  November 09, 2009 4:07 PM

## 2010-04-25 NOTE — Letter (Signed)
Summary: [facility]  [facility]   Imported By: Arta Bruce 01/04/2010 12:35:57  _____________________________________________________________________  External Attachment:    Type:   Image     Comment:   External Document

## 2010-04-25 NOTE — Assessment & Plan Note (Signed)
Summary: PREGNANCY TEST////KT   Nurse Visit   Allergies: No Known Drug Allergies Laboratory Results   Urine Tests  Date/Time Received: October 20, 2009 3:22 PM     Urine HCG: positive    Orders Added: 1)  Est. Patient Level I [[address]] 2)  T-Pregnancy Test, Urine, Qual [[address]]  Laboratory Results   Urine Tests      Urine HCG: positive

## 2010-05-19 ENCOUNTER — Inpatient Hospital Stay (HOSPITAL_COMMUNITY)
Admission: AD | Admit: 2010-05-19 | Discharge: 2010-05-19 | Disposition: A | Discharge status: Home / Self Care | Payer: Medicaid Other | Source: Ambulatory Visit | Attending: Obstetrics & Gynecology | Admitting: Obstetrics & Gynecology

## 2010-05-19 DIAGNOSIS — O4700 False labor before 37 completed weeks of gestation, unspecified trimester: Secondary | ICD-10-CM | POA: Insufficient documentation

## 2010-05-19 LAB — URINALYSIS, ROUTINE W REFLEX MICROSCOPIC
Bilirubin Urine: NEGATIVE
Leukocytes, UA: NEGATIVE
Nitrite: NEGATIVE
Specific Gravity, Urine: 1.015 (ref 1.005–1.030)
pH: 6.5 (ref 5.0–8.0)

## 2010-05-19 LAB — URINE MICROSCOPIC-ADD ON

## 2010-05-30 ENCOUNTER — Inpatient Hospital Stay (HOSPITAL_COMMUNITY)
Admission: AD | Admit: 2010-05-30 | Discharge: 2010-05-30 | Disposition: A | Discharge status: Home / Self Care | Payer: Medicaid Other | Source: Ambulatory Visit | Attending: Obstetrics | Admitting: Obstetrics

## 2010-05-30 ENCOUNTER — Encounter (HOSPITAL_COMMUNITY): Payer: Self-pay | Admitting: Radiology

## 2010-05-30 ENCOUNTER — Inpatient Hospital Stay (HOSPITAL_COMMUNITY): Payer: Medicaid Other

## 2010-05-30 DIAGNOSIS — O4700 False labor before 37 completed weeks of gestation, unspecified trimester: Secondary | ICD-10-CM | POA: Insufficient documentation

## 2010-05-31 ENCOUNTER — Inpatient Hospital Stay (HOSPITAL_COMMUNITY)
Admission: AD | Admit: 2010-05-31 | Discharge: 2010-06-02 | DRG: 775 | Disposition: A | Discharge status: Home / Self Care | Payer: Medicaid Other | Source: Ambulatory Visit | Attending: Obstetrics & Gynecology | Admitting: Obstetrics & Gynecology

## 2010-05-31 DIAGNOSIS — O09519 Supervision of elderly primigravida, unspecified trimester: Secondary | ICD-10-CM | POA: Diagnosis present

## 2010-05-31 DIAGNOSIS — O99892 Other specified diseases and conditions complicating childbirth: Principal | ICD-10-CM | POA: Diagnosis present

## 2010-05-31 DIAGNOSIS — Z22330 Carrier of Group B streptococcus: Secondary | ICD-10-CM

## 2010-05-31 LAB — CBC
MCH: 25.7 pg — ABNORMAL LOW (ref 26.0–34.0)
MCHC: 32.8 g/dL (ref 30.0–36.0)
Platelets: 151 10*3/uL (ref 150–400)
RBC: 4.39 MIL/uL (ref 3.87–5.11)

## 2010-06-01 LAB — RPR: RPR Ser Ql: NONREACTIVE

## 2010-06-01 LAB — CBC
MCHC: 32.4 g/dL (ref 30.0–36.0)
RDW: 14.6 % (ref 11.5–15.5)

## 2010-06-05 NOTE — H&P (Signed)
  Bridget Trujillo, MAIDEN                 ACCOUNT NO.:  [id number]  MEDICAL RECORD NO.:  [id number]           PATIENT TYPE:  I  LOCATION:  9116                          FACILITY:  [facility]  PHYSICIAN:  Roseanna Rainbow, M.D.DATE OF BIRTH:  Dec 13, 1970  DATE OF ADMISSION:  05/31/2010 DATE OF DISCHARGE:                             HISTORY & PHYSICAL   CHIEF COMPLAINT:  The patient is a 40 year old para 1 with an estimated date of confinement of June 22, 2010, complaining of contractions and spontaneous rupture of membranes.  HISTORY OF PRESENT ILLNESS:  Please see the above.  The patient reports rupture of membranes for clear fluid.  ALLERGIES:  No known drug allergies.  MEDICATIONS:  Prenatal vitamins, Tegretol, folic acid.  OB RISK FACTORS:  History of seizure disorder, advanced maternal age. Prenatal labs: Chlamydia probe negative.  GC probe negative.  2-hour GTT negative.  Hepatitis B surface antigen negative.  Hematocrit 36.0, hemoglobin 13.  HIV nonreactive, platelets 235,000.  Blood type is O+, antibody screen negative, RPR nonreactive, rubella immune.  GBS positive.  PAST OB HISTORY:  She has a history of a spontaneous abortion in December 1994.  She was delivered at 38 weeks 6 pounds 7 ounces female vaginal delivery, no complications.   PAST GYN HISTORY:  There is a history of cervical dysplasia and a laser conization of the cervix.  PAST MEDICAL HISTORY:  Seizure disorder.  PAST SURGICAL HISTORY:  Tonsils and adenoids.  SOCIAL HISTORY:    She is single, not using alcohol currently, has no significant smoking history.  Denies illicit drug use.  FAMILY HISTORY:  Remarkable for hypertension.  REVIEW OF SYSTEMS:  GU:  Please see the above.  PHYSICAL EXAMINATION:  VITAL SIGNS:  Stable, afebrile.  The fetal heart tracing is baseline 140, moderate long-term variability. PELVIC:  Tocodynamometer irregular uterine contractions.  Sterile vaginal exam per the RN the cervix  is 1 cm dilated.  ASSESSMENT:  Primipara at term, early labor.  GBS positive, history of a seizure disorder.  PLAN:  Admission, penicillin for GBS prophylaxis.  Monitor progress, anticipate a vaginal delivery.     Roseanna Rainbow, M.D.     [name]  D:  05/31/2010  T:  06/01/2010  Job:  [id number]  Electronically Signed by Antionette Char M.D. on 06/05/2010 09:52:39 PM

## 2010-06-22 ENCOUNTER — Inpatient Hospital Stay (HOSPITAL_COMMUNITY): Admission: AD | Admit: 2010-06-22 | Payer: Self-pay | Admitting: Obstetrics & Gynecology

## 2010-07-04 LAB — BASIC METABOLIC PANEL
Calcium: 9 mg/dL (ref 8.4–10.5)
GFR calc Af Amer: >60 mL/min (ref >60)
GFR calc non Af Amer: >60 mL/min (ref >60)
Sodium: 137 mEq/L (ref 135–145)

## 2010-12-18 LAB — WET PREP, GENITAL
Trich, Wet Prep: NONE SEEN
Yeast Wet Prep HPF POC: NONE SEEN

## 2010-12-18 LAB — CBC
Platelets: 212
RDW: 13.6

## 2010-12-18 LAB — POCT PREGNANCY, URINE
Operator id: 288861
Preg Test, Ur: POSITIVE

## 2010-12-18 LAB — GC/CHLAMYDIA PROBE AMP, GENITAL: Chlamydia, DNA Probe: NEGATIVE

## 2010-12-19 LAB — CBC
Hemoglobin: 13.2
MCHC: 33.5
MCV: 81.9
RBC: 4.79
WBC: 7

## 2011-01-02 LAB — I-STAT 8, (EC8 V) (CONVERTED LAB)
Acid-base deficit: 1
Bicarbonate: 24.5 — ABNORMAL HIGH
HCT: 45
Hemoglobin: 15.3 — ABNORMAL HIGH
Operator id: 196461
Sodium: 140
TCO2: 26
pCO2, Ven: 43.1 — ABNORMAL LOW

## 2011-01-02 LAB — CBC
HCT: 38.9
Hemoglobin: 12.6
MCV: 79.8
Platelets: 231
WBC: 7.4

## 2011-01-02 LAB — URINALYSIS, ROUTINE W REFLEX MICROSCOPIC
Bilirubin Urine: NEGATIVE
Glucose, UA: NEGATIVE
Hgb urine dipstick: NEGATIVE
Ketones, ur: NEGATIVE
Leukocytes, UA: NEGATIVE
Protein, ur: 30 — AB
pH: 5.5

## 2011-01-02 LAB — DIFFERENTIAL
Eosinophils Absolute: 0
Eosinophils Relative: 1
Lymphs Abs: 1.7
Monocytes Absolute: 0.6

## 2011-01-02 LAB — POCT PREGNANCY, URINE
Operator id: 19646
Preg Test, Ur: NEGATIVE

## 2011-01-02 LAB — POCT I-STAT CREATININE
Creatinine, Ser: 0.9
Operator id: 196461

## 2011-01-17 ENCOUNTER — Other Ambulatory Visit: Payer: Self-pay | Admitting: Internal Medicine

## 2011-01-17 DIAGNOSIS — Z30430 Encounter for insertion of intrauterine contraceptive device: Secondary | ICD-10-CM

## 2011-01-30 ENCOUNTER — Ambulatory Visit (HOSPITAL_COMMUNITY)
Admission: RE | Admit: 2011-01-30 | Discharge: 2011-01-30 | Disposition: A | Discharge status: Home / Self Care | Payer: Self-pay | Source: Ambulatory Visit | Attending: Internal Medicine | Admitting: Internal Medicine

## 2011-01-30 DIAGNOSIS — N83209 Unspecified ovarian cyst, unspecified side: Secondary | ICD-10-CM | POA: Insufficient documentation

## 2011-01-30 DIAGNOSIS — Z30431 Encounter for routine checking of intrauterine contraceptive device: Secondary | ICD-10-CM | POA: Insufficient documentation

## 2011-01-30 DIAGNOSIS — Z30430 Encounter for insertion of intrauterine contraceptive device: Secondary | ICD-10-CM

## 2011-09-03 ENCOUNTER — Other Ambulatory Visit (HOSPITAL_COMMUNITY): Payer: Self-pay | Admitting: Internal Medicine

## 2011-09-03 DIAGNOSIS — Z1231 Encounter for screening mammogram for malignant neoplasm of breast: Secondary | ICD-10-CM

## 2011-09-25 ENCOUNTER — Ambulatory Visit (HOSPITAL_COMMUNITY): Payer: Medicaid Other

## 2011-09-25 ENCOUNTER — Ambulatory Visit (HOSPITAL_COMMUNITY)
Admission: RE | Admit: 2011-09-25 | Discharge: 2011-09-25 | Disposition: A | Discharge status: Home / Self Care | Payer: Self-pay | Source: Ambulatory Visit | Attending: Internal Medicine | Admitting: Internal Medicine

## 2011-09-25 ENCOUNTER — Encounter (HOSPITAL_COMMUNITY): Payer: Self-pay

## 2011-09-25 DIAGNOSIS — Z1231 Encounter for screening mammogram for malignant neoplasm of breast: Secondary | ICD-10-CM | POA: Insufficient documentation

## 2011-12-05 ENCOUNTER — Encounter: Payer: Self-pay | Admitting: Family Medicine

## 2011-12-05 ENCOUNTER — Ambulatory Visit (INDEPENDENT_AMBULATORY_CARE_PROVIDER_SITE_OTHER): Payer: Self-pay | Admitting: Family Medicine

## 2011-12-05 VITALS — BP 128/89 | HR 75 | Ht 65.16 in | Wt 219.0 lb

## 2011-12-05 DIAGNOSIS — R569 Unspecified convulsions: Secondary | ICD-10-CM

## 2011-12-05 DIAGNOSIS — L0291 Cutaneous abscess, unspecified: Secondary | ICD-10-CM

## 2011-12-05 DIAGNOSIS — L0390 Cellulitis, unspecified: Secondary | ICD-10-CM

## 2011-12-05 MED ORDER — MUPIROCIN CALCIUM 2 % EX CREA: TOPICAL_CREAM | Freq: Three times a day (TID) | CUTANEOUS | Status: AC

## 2011-12-05 MED ORDER — CARBAMAZEPINE ER 400 MG PO TB12: 400.0000 mg | ORAL_TABLET | Freq: Every day | ORAL | Status: DC

## 2011-12-05 NOTE — Patient Instructions (Addendum)
Make appointment for fasting bloodwork  If redness spread or gets worse, please let me know  Follow-up in 1 year or sooner if needed

## 2011-12-06 ENCOUNTER — Encounter: Payer: Self-pay | Admitting: Family Medicine

## 2011-12-06 DIAGNOSIS — L0390 Cellulitis, unspecified: Secondary | ICD-10-CM | POA: Insufficient documentation

## 2011-12-06 NOTE — Assessment & Plan Note (Signed)
Small erythema and excoriation without fluctuance.  Will treat topically with mupirocin.  Advised to return if does not improve or gets worse.

## 2011-12-06 NOTE — Progress Notes (Signed)
  Subjective:    Patient ID: Bridget Trujillo, female    DOB: 06-Jul-1970, 41 y.o.   MRN: [id number]  HPI Here to establish care.  Was previously a patient at [facility].   Brings records for me today.  Sore spot behind ear:  Present fr several days.  Sore.  Concerned it might be a bug bite.  Not worsening or improving.  No fever chills.  Seizure disorder:  Dx in 2004.  Has not had a seizure since 2009.  Takes tegretol 400 mg daily.  Has not had neruology care since moving to [place] years ago.   Review of Systems    Patient Information Form: Screening and ROS  AUDIT-C Score: 0 Do you feel safe in relationships? yes PHQ-2:negative  Review of Symptoms  General:  Negative for nexplained weight loss, fever Skin: Negative for new or changing mole, sore that won't heal HEENT: Negative for trouble hearing, trouble seeing, ringing in ears, mouth sores, hoarseness, change in voice, dysphagia. CV:  Negative for chest pain, dyspnea, edema, palpitations Resp: Negative for cough, dyspnea, hemoptysis GI: Negative for nausea, vomiting, diarrhea, constipation, abdominal pain, melena, hematochezia. GU: Negative for dysuria, incontinence, urinary hesitance, hematuria, vaginal or penile discharge, polyuria, sexual difficulty, lumps in testicle or breasts MSK: Negative for muscle cramps or aches, joint pain or swelling Neuro: Negative for headaches, weakness, numbness, dizziness, passing out/fainting Psych: Negative for depression, anxiety, memory problems   Objective:   Physical Exam GEN: Alert & Oriented, No acute distress HEENT: [place]/AT. EOMI, PERRLA, no conjunctival injection or scleral icterus.  Bilateral tympanic membranes intact without erythema or effusion.  .  Nares without edema or rhinorrhea.  Oropharynx is without erythema or exudates.  No anterior or posterior cervical lymphadenopathy. CV:  Regular Rate & Rhythm, no murmur Respiratory:  Normal work of breathing, CTAB Abd:  + BS, soft, no  tenderness to palpation Ext: no pre-tibial edema Skin:  Small excoriation behind left ear.  Dime sized erythematous patch in scalp above ear- tender, no fluctuance, no raised edges no hair loss- does not look classicly like tinea        Assessment & Plan:

## 2011-12-06 NOTE — Assessment & Plan Note (Signed)
Stable for many years on tegretol 400 mg daily.  Discussed with patient non-standard dosing- thinks it may have been changes due to availability of meds from [employer] [employer].  Wrote a paper prescription given to her today to take to [facility] program to get enrolled after seeing deborah Hill.  May consider returning to 200 BID dosing if available in the future.

## 2011-12-14 ENCOUNTER — Telehealth: Payer: Self-pay | Admitting: *Deleted

## 2011-12-14 NOTE — Telephone Encounter (Signed)
Need to clarify Rx for Tegretol XR 400 mg. Not available at the [facility]. They only have Tegretol 200 mg. Please call with directions for the Tegretol 200 mg. (2 tabs daily or 1 tab bid) Paged Dr.Briscoe and also fwd. Request. .Arlyss Repress

## 2011-12-17 MED ORDER — CARBAMAZEPINE ER 200 MG PO TB12: 200.0000 mg | ORAL_TABLET | Freq: Two times a day (BID) | ORAL | Status: DC

## 2011-12-17 NOTE — Telephone Encounter (Signed)
Printed out new rx- will fax to [facility]

## 2012-01-09 ENCOUNTER — Telehealth: Payer: Self-pay | Admitting: Family Medicine

## 2012-01-09 NOTE — Telephone Encounter (Signed)
Crystal at [facility] needs to clarify a medication on this patient and needs to speak to [place].

## 2012-01-10 NOTE — Telephone Encounter (Signed)
Crystal calls back stating she can order tegretol XR 400 mg. Will ask MD to put in new order for this so [facility] can be  straight on their records.

## 2012-01-10 NOTE — Telephone Encounter (Signed)
Bridget Trujillo is calling to make sure about tegretol XR prescription. Patient has an  previous bottle from [facility] that is 400 mg . Then MD sent in Rx for   XR 200 mg one twice daily. She wants to make sure. Explained that back in Sept it had been changed because they didn't have the XR  400mg  tab. Marland Kitchen She will check to see if she can order Tegretol XR 400 mg

## 2012-01-11 NOTE — Telephone Encounter (Signed)
The 400 mg daily from [facility] was temporary due to unable to get 200 mg.  The preferred dose is 200 mg twice daily.  Please continue this as prescribed.

## 2012-01-11 NOTE — Telephone Encounter (Signed)
Dianne at [facility] notified.

## 2012-02-07 ENCOUNTER — Telehealth: Payer: Self-pay | Admitting: Family Medicine

## 2012-02-07 NOTE — Telephone Encounter (Signed)
Patient is calling about the Rx for Tegretol XR.  [facility] has been trying to reach Dr. Earnest Bailey to get clarification on the dosage.  She believes that the name of the person to speak to is [place].

## 2012-02-07 NOTE — Telephone Encounter (Signed)
Spoke with pharmacy and gave the rx directions

## 2012-02-25 ENCOUNTER — Other Ambulatory Visit: Payer: Self-pay

## 2012-02-25 DIAGNOSIS — R569 Unspecified convulsions: Secondary | ICD-10-CM

## 2012-02-25 LAB — CBC WITH DIFFERENTIAL/PLATELET
Eosinophils Absolute: 0.1 10*3/uL (ref 0.0–0.7)
Eosinophils Relative: 1 % (ref 0–5)
HCT: 38.4 % (ref 36.0–46.0)
Hemoglobin: 12.8 g/dL (ref 12.0–15.0)
Lymphs Abs: 1.8 10*3/uL (ref 0.7–4.0)
MCH: 27.1 pg (ref 26.0–34.0)
MCV: 81.2 fL (ref 78.0–100.0)
Monocytes Absolute: 0.3 10*3/uL (ref 0.1–1.0)
Monocytes Relative: 5 % (ref 3–12)
Neutrophils Relative %: 55 % (ref 43–77)
RBC: 4.73 MIL/uL (ref 3.87–5.11)

## 2012-02-25 LAB — COMPREHENSIVE METABOLIC PANEL
CO2: 29 mEq/L (ref 19–32)
Glucose, Bld: 80 mg/dL (ref 70–99)
Sodium: 140 mEq/L (ref 135–145)
Total Bilirubin: 0.5 mg/dL (ref 0.3–1.2)
Total Protein: 6.7 g/dL (ref 6.0–8.3)

## 2012-02-25 LAB — LIPID PANEL
Cholesterol: 192 mg/dL (ref 0–200)
Total CHOL/HDL Ratio: 3 Ratio

## 2012-02-25 NOTE — Progress Notes (Signed)
LABS DONE TODAY Kaveh Kissinger 

## 2012-02-26 ENCOUNTER — Encounter: Payer: Self-pay | Admitting: Family Medicine

## 2012-02-26 LAB — CARBAMAZEPINE LEVEL, TOTAL: Carbamazepine Lvl: 4.8 ug/mL (ref 4.0–12.0)

## 2012-04-08 ENCOUNTER — Other Ambulatory Visit: Payer: Self-pay | Admitting: Family Medicine

## 2012-04-08 MED ORDER — CARBAMAZEPINE ER 200 MG PO TB12: 200.0000 mg | ORAL_TABLET | Freq: Two times a day (BID) | ORAL | Status: DC

## 2012-10-07 ENCOUNTER — Telehealth: Payer: Self-pay | Admitting: Family Medicine

## 2012-10-07 NOTE — Telephone Encounter (Signed)
Left message for pt to call back.  Please inform that she needs an OV for medication refills.  Teondre Jarosz,  Qunicy Higinbotham

## 2012-10-07 NOTE — Telephone Encounter (Signed)
Please call patient,she need follow up to refill seizure medication,last visit was more than 1 yr ago.

## 2012-10-21 ENCOUNTER — Ambulatory Visit (INDEPENDENT_AMBULATORY_CARE_PROVIDER_SITE_OTHER): Payer: BC Managed Care – PPO | Admitting: Family Medicine

## 2012-10-21 ENCOUNTER — Encounter: Payer: Self-pay | Admitting: Family Medicine

## 2012-10-21 VITALS — BP 114/79 | HR 68 | Temp 98.1°F | Wt 206.0 lb

## 2012-10-21 DIAGNOSIS — R569 Unspecified convulsions: Secondary | ICD-10-CM

## 2012-10-21 MED ORDER — CARBAMAZEPINE ER 200 MG PO TB12: 200.0000 mg | ORAL_TABLET | Freq: Two times a day (BID) | ORAL | Status: DC

## 2012-10-21 NOTE — Patient Instructions (Signed)
Seizure, Adult A seizure means there is unusual activity in the brain. A seizure can cause changes in attention or behavior. Seizures often cause shaking (convulsions). Seizures often last from 30 seconds to 2 minutes. HOME CARE   If you are given medicines, take them exactly as told by your doctor.  Keep all doctor visits as told.  Do not swim or drive until your doctor says it is okay.  Teach others what to do if you have a seizure. They should:  Lay you on the ground.  Put a cushion under your head.  Loosen any tight clothing around your neck.  Turn you on your side.  Stay with you until you get better. GET HELP RIGHT AWAY IF:   The seizure lasts longer than 2 to 5 minutes.  The seizure is very bad.  The person does not wake up after the seizure.  The person's attention or behavior changes. Drive the person to the emergency room or call your local emergency services (911 in [place] MAKE SURE YOU:   Understand these instructions.  Will watch your condition.  Will get help right away if you are not doing well or get worse. Document Released: 08/29/2007 Document Revised: 06/04/2011 Document Reviewed: 02/28/2011 ExitCare Patient Information 2014 [employer].  

## 2012-10-21 NOTE — Progress Notes (Signed)
Patient ID: Bridget Trujillo, female   DOB: 1970-11-26, 42 y.o.   MRN: [id number] Seizure Disorder Episodes first started 2004,last episode was in 2009. Onset of symptoms was sudden, not related to any specific activity. She has had multiple episodes. She has had completely unresponsive during worst of episode. Abnormal movements present. Episode duration is several minutes. Premonitory symptoms include none. After episodes she has  immediate return to normal level of consciousness. Seizures appear to be precipitated by no precipitation factors noted. Patient does not have a history of head trauma.  She does not have a history of CVA. She does not have a history of alcohol abuse. She does not have a history of drug abuse. She does not have a history of developmental delay. She does not have a family history of seizure disorder. Work up thus far has been: neurology consultation CT scan of head EEG. Treatment thus far has been: carbamazepine (Tegretol), with good results.  Current Outpatient Prescriptions on File Prior to Visit  Medication Sig Dispense Refill  . levonorgestrel (MIRENA) 20 MCG/24HR IUD 1 each by Intrauterine route once. May 2012       No current facility-administered medications on file prior to visit.   Past Medical History  Diagnosis Date  . Seizure disorder   . Seizures   . Allergy     Filed Vitals:   10/21/12 1025  BP: 114/79  Pulse: 68  Temp: 98.1 F (36.7 C)  TempSrc: Oral  Weight: 206 lb (93.441 kg)     Physical Exam  Nursing note and vitals reviewed. Constitutional: She is oriented to person, place, and time. She appears well-developed. No distress.  Eyes: EOM are normal. Pupils are equal, round, and reactive to light.  Neck: Neck supple.  Cardiovascular: Normal rate, regular rhythm and normal heart sounds.   No murmur heard. Pulmonary/Chest: Effort normal and breath sounds normal. No respiratory distress. She has no wheezes.  Abdominal: Soft. Bowel sounds are  normal.  Musculoskeletal: Normal range of motion. She exhibits no tenderness.  Neurological: She is alert and oriented to person, place, and time. She has normal strength and normal reflexes. No cranial nerve deficit or sensory deficit.    A/P: Seizure disorder.        Currently asymptomatic and stable for the last 5 yrs on tegretol 200mg  BID.        I reviewed lab work done Dec 2013, normal level of Tegretol, Bmet normal, repeat labs in 5 months.        I refilled her Tegretol XR 200 mg BID.        F/U prn.

## 2012-10-21 NOTE — Assessment & Plan Note (Signed)
Currently asymptomatic and stable for the last 5 yrs on tegretol 200mg  BID.        I reviewed lab work done Dec 2013, normal level of Tegretol, Bmet normal, repeat labs in 5 months.        I refilled her Tegretol XR 200mg  BID.        F/U prn.

## 2012-11-09 ENCOUNTER — Emergency Department (HOSPITAL_COMMUNITY)
Admission: EM | Admit: 2012-11-09 | Discharge: 2012-11-10 | Disposition: A | Discharge status: Home / Self Care | Payer: BC Managed Care – PPO | Attending: Emergency Medicine | Admitting: Emergency Medicine

## 2012-11-09 ENCOUNTER — Emergency Department (HOSPITAL_COMMUNITY): Payer: BC Managed Care – PPO

## 2012-11-09 ENCOUNTER — Encounter (HOSPITAL_COMMUNITY): Payer: Self-pay | Admitting: Emergency Medicine

## 2012-11-09 DIAGNOSIS — R569 Unspecified convulsions: Secondary | ICD-10-CM | POA: Insufficient documentation

## 2012-11-09 DIAGNOSIS — Z79899 Other long term (current) drug therapy: Secondary | ICD-10-CM | POA: Insufficient documentation

## 2012-11-09 DIAGNOSIS — G40909 Epilepsy, unspecified, not intractable, without status epilepticus: Secondary | ICD-10-CM | POA: Insufficient documentation

## 2012-11-09 DIAGNOSIS — Y9389 Activity, other specified: Secondary | ICD-10-CM | POA: Insufficient documentation

## 2012-11-09 DIAGNOSIS — Z3202 Encounter for pregnancy test, result negative: Secondary | ICD-10-CM | POA: Insufficient documentation

## 2012-11-09 DIAGNOSIS — M549 Dorsalgia, unspecified: Secondary | ICD-10-CM

## 2012-11-09 DIAGNOSIS — Z791 Long term (current) use of non-steroidal anti-inflammatories (NSAID): Secondary | ICD-10-CM | POA: Insufficient documentation

## 2012-11-09 DIAGNOSIS — Y929 Unspecified place or not applicable: Secondary | ICD-10-CM | POA: Insufficient documentation

## 2012-11-09 DIAGNOSIS — S335XXA Sprain of ligaments of lumbar spine, initial encounter: Secondary | ICD-10-CM | POA: Insufficient documentation

## 2012-11-09 DIAGNOSIS — X500XXA Overexertion from strenuous movement or load, initial encounter: Secondary | ICD-10-CM | POA: Insufficient documentation

## 2012-11-09 DIAGNOSIS — S39012A Strain of muscle, fascia and tendon of lower back, initial encounter: Secondary | ICD-10-CM

## 2012-11-09 LAB — URINALYSIS, ROUTINE W REFLEX MICROSCOPIC
Ketones, ur: NEGATIVE mg/dL
Leukocytes, UA: NEGATIVE
Nitrite: NEGATIVE
Protein, ur: NEGATIVE mg/dL
Urobilinogen, UA: 1 mg/dL (ref 0.0–1.0)
pH: 7 (ref 5.0–8.0)

## 2012-11-09 MED ORDER — IBUPROFEN 400 MG PO TABS
800.0000 mg | ORAL_TABLET | Freq: Once | ORAL | Status: AC
  Administered 2012-11-09: 800 mg via ORAL
  Filled 2012-11-09: qty 2

## 2012-11-09 MED ORDER — CYCLOBENZAPRINE HCL 10 MG PO TABS: 10.0000 mg | ORAL_TABLET | Freq: Two times a day (BID) | ORAL | Status: DC | PRN

## 2012-11-09 MED ORDER — NAPROXEN 500 MG PO TABS: 500.0000 mg | ORAL_TABLET | Freq: Two times a day (BID) | ORAL | Status: DC

## 2012-11-09 MED ORDER — METHOCARBAMOL 500 MG PO TABS
500.0000 mg | ORAL_TABLET | Freq: Once | ORAL | Status: AC
  Administered 2012-11-09: 500 mg via ORAL
  Filled 2012-11-09: qty 1

## 2012-11-09 NOTE — ED Provider Notes (Signed)
This chart was scribed for Vinetta Bergamo, a non-physician practitioner working with No att. providers found by Lewanda Rife, ED Scribe. This patient was seen in room TR04C/TR04C and the patient's care was started at 2208.    CSN: [id number]     Arrival date & time 11/09/12  1917 History     First MD Initiated Contact with Patient 11/09/12 2116     Chief Complaint  Patient presents with  . Back Pain   (Consider location/radiation/quality/duration/timing/severity/associated sxs/prior Treatment) The history is provided by the patient.   HPI Comments: Bridget Trujillo is a 42 y.o. female who presents to the Emergency Department complaining of constant moderate left lower back pain radiating down left leg onset acute 10:30 am this morning after bending down to apply bandage to daughter. Describes pain as sharp, reported that it feels like a "pinched nerve."  Reports associated intermittent tingling to the left leg. Reports pain is aggravated with movement. Reports trying Motrin and topical "horse muscular analgesic" with no relief of symptoms. Denied neck pain, fall, injury, urinary and bowel incontinence, numbness, chest pain, shortness of breath, difficulty breathing.  PCP Dr. Clide Deutscher     Past Medical History  Diagnosis Date  . Seizure disorder   . Seizures   . Allergy    History reviewed. No pertinent past surgical history. Family History  Problem Relation Age of Onset  . Hypertension Mother   . Hypertension Father   . Hypertension Son   . Stroke Neg Hx   . Heart disease Neg Hx   . Cancer Neg Hx    History  Substance Use Topics  . Smoking status: Never Smoker   . Smokeless tobacco: Never Used  . Alcohol Use: No   OB History   Grav Para Term Preterm Abortions TAB SAB Ect Mult Living   1              Review of Systems  Musculoskeletal: Positive for back pain.  A complete 10 system review of systems was obtained and all systems are negative except as noted in the  HPI and PMH.     Allergies  Review of patient's allergies indicates no known allergies.  Home Medications   Current Outpatient Rx  Name  Route  Sig  Dispense  Refill  . carbamazepine (TEGRETOL XR) 200 MG 12 hr tablet   Oral   Take 1 tablet (200 mg total) by mouth 2 (two) times daily.   60 tablet   5   . levonorgestrel (MIRENA) 20 MCG/24HR IUD   Intrauterine   1 each by Intrauterine route once. May 2012         . cyclobenzaprine (FLEXERIL) 10 MG tablet   Oral   Take 1 tablet (10 mg total) by mouth 2 (two) times daily as needed for muscle spasms.   20 tablet   0   . naproxen (NAPROSYN) 500 MG tablet   Oral   Take 1 tablet (500 mg total) by mouth 2 (two) times daily.   30 tablet   0    BP 130/90  Pulse 70  Temp(Src) 98.1 F (36.7 C) (Oral)  Resp 16  SpO2 100%  LMP 10/21/2012 Physical Exam  Nursing note and vitals reviewed. Constitutional: She is oriented to person, place, and time. She appears well-developed and well-nourished. No distress.  HENT:  Head: Normocephalic and atraumatic.  Mouth/Throat: Oropharynx is clear and moist. No oropharyngeal exudate.  Uvula midline  Eyes: Conjunctivae and EOM are normal. Pupils  are equal, round, and reactive to light. Right eye exhibits no discharge. Left eye exhibits no discharge.  Neck: Normal range of motion. Neck supple. No tracheal deviation present.  Negative neck stiffness Negative nuchal rigidity Negative pain upon palpation to cervical spine  Cardiovascular: Normal rate, regular rhythm and normal heart sounds.  Exam reveals no friction rub.   No murmur heard. Pulses:      Radial pulses are 2+ on the right side, and 2+ on the left side.       Dorsalis pedis pulses are 2+ on the right side, and 2+ on the left side.  Pulmonary/Chest: Effort normal and breath sounds normal. No respiratory distress. She has no wheezes. She has no rales.  Abdominal:  Negative CVA tenderness  Musculoskeletal: Normal range of motion.        Back:  Mild discomfort upon palpation to the left lower lumbosacral region, muscular nature Full range of motion to upper and lower extremities bilaterally without discomfort noted  Neurological: She is alert and oriented to person, place, and time. No cranial nerve deficit. She exhibits normal muscle tone. Coordination normal.  Cranial nerves III through XII grossly intact Sensation intact to upper and lower extremities bilaterally with differentiation to sharp and dull touch Strength 5+/5+ to upper and lower extremities bilaterally Gait proper,-negative sway, proper balance Patient able to flex and touch her toes.  Skin: Skin is warm and dry.  Psychiatric: She has a normal mood and affect. Her behavior is normal.    ED Course   Procedures (including critical care time) Medications  ibuprofen (ADVIL,MOTRIN) tablet 800 mg (800 mg Oral Given 11/09/12 2319)  methocarbamol (ROBAXIN) tablet 500 mg (500 mg Oral Given 11/09/12 2319)    Labs Reviewed  URINALYSIS, ROUTINE W REFLEX MICROSCOPIC - Abnormal; Notable for the following:    Specific Gravity, Urine 1.033 (*)    Bilirubin Urine SMALL (*)    All other components within normal limits  PREGNANCY, URINE   Dg Lumbar Spine Complete  11/09/2012   *RADIOLOGY REPORT*  Clinical Data: Injury, pain.  LUMBAR SPINE - COMPLETE 4+ VIEW  Comparison: None.  Findings: There are five lumbar-type vertebral bodies.  No fracture or malalignment.  Disc spaces well maintained.  SI joints are symmetric.  IUD noted within the pelvis.  IMPRESSION: No acute bony abnormality.   Original Report Authenticated By: Charlett Nose, M.D.   1. Lumbar strain, initial encounter   2. Back pain     MDM  I personally performed the services described in this documentation, which was scribed in my presence. The recorded information has been reviewed and is accurate.  Patient presenting to the emergency department with low back pain that started approximately 10:30 to  11:00 this morning, reported that she went to go down and apply a bandage to her daughter when she felt a pop in her back. Reported that discomfort feels like a pinched nerve. Alert and oriented. Sensation intact. Strength intact and equal. Full range of motion to upper and lower extremities bilaterally. Gait proper without limp and swelling noted, good balance. Patient able to flex, bend over and touch her toes but her fingers. Pulses palpable. Negative neurological deficits noted.  Urine negative for infection, negative urine pregnancy test. Lumbar spine x-ray negative for fracture or malalignment, negative for disc disease. Patient stable, afebrile. Pleasant and laughing throughout interview and physical exam. Pain controlled in ED setting. Discharged patient with anti-inflammatories and muscle relaxer. Referred patient to primary care provider and orthopedics.  Discussed with patient to rest and avoid strenuous activity. Discussed with patient to apply warm compressions, icy hot ointment massage. Discussed with patient to continue to monitor symptoms and if symptoms are to worsen or change to report back to the emergency department-strict return instructions given. Patient agreed to plan of care, understood, all questions answered.  [employer], PA-C 11/10/12 0222

## 2012-11-09 NOTE — ED Notes (Signed)
Back from xray

## 2012-11-09 NOTE — ED Notes (Signed)
PT. BENT DOWN THIS MORNING AND " FELT A POP" , REPORTS PAIN AT LEFT LOWER BACK , AMBULATORY , DENIES URINARY DISCOMFORT.

## 2012-11-09 NOTE — ED Notes (Signed)
Pt alert, NAD, calm, interactive, to xray via stretcher, meds given, family at [facility].

## 2012-11-13 NOTE — ED Provider Notes (Signed)
Medical screening examination/treatment/procedure(s) were performed by non-physician practitioner and as supervising physician I was immediately available for consultation/collaboration.   Avira Tillison E Dakarri Kessinger, MD 11/13/12 1518 

## 2013-02-26 ENCOUNTER — Telehealth: Payer: Self-pay | Admitting: Family Medicine

## 2013-02-26 ENCOUNTER — Other Ambulatory Visit: Payer: Self-pay | Admitting: Family Medicine

## 2013-02-26 MED ORDER — CARBAMAZEPINE ER 200 MG PO TB12: 200.0000 mg | ORAL_TABLET | Freq: Two times a day (BID) | ORAL | Status: DC

## 2013-02-26 NOTE — Telephone Encounter (Signed)
Needs refill on tegretol xr 200 mg. Dr Lum Babe has no appts in dec Please advise

## 2013-02-26 NOTE — Telephone Encounter (Signed)
Pt informed.  Dr. Lum Babe did you print this and fax it or do i need to call it in? [place], Maryjo Rochester

## 2013-02-26 NOTE — Telephone Encounter (Signed)
I called prescription in to the pharmacy already. Thanks for the offer though.

## 2013-02-26 NOTE — Telephone Encounter (Signed)
Schedule earliest appointment with me, I will give her 1 month supply of her medication.

## 2013-04-14 ENCOUNTER — Ambulatory Visit (INDEPENDENT_AMBULATORY_CARE_PROVIDER_SITE_OTHER): Payer: BC Managed Care – PPO | Admitting: Family Medicine

## 2013-04-14 ENCOUNTER — Encounter: Payer: Self-pay | Admitting: Family Medicine

## 2013-04-14 VITALS — BP 122/85 | HR 75 | Temp 98.7°F | Ht 65.0 in | Wt 204.0 lb

## 2013-04-14 DIAGNOSIS — L0390 Cellulitis, unspecified: Secondary | ICD-10-CM

## 2013-04-14 DIAGNOSIS — G40909 Epilepsy, unspecified, not intractable, without status epilepticus: Secondary | ICD-10-CM

## 2013-04-14 DIAGNOSIS — L0291 Cutaneous abscess, unspecified: Secondary | ICD-10-CM

## 2013-04-14 LAB — COMPREHENSIVE METABOLIC PANEL
ALBUMIN: 4.1 g/dL (ref 3.5–5.2)
ALK PHOS: 50 U/L (ref 39–117)
ALT: 12 U/L (ref 0–35)
AST: 16 U/L (ref 0–37)
BILIRUBIN TOTAL: 0.4 mg/dL (ref 0.3–1.2)
BUN: 11 mg/dL (ref 6–23)
CO2: 28 mEq/L (ref 19–32)
Calcium: 9 mg/dL (ref 8.4–10.5)
Chloride: 107 mEq/L (ref 96–112)
Creat: 0.81 mg/dL (ref 0.50–1.10)
GLUCOSE: 82 mg/dL (ref 70–99)
Potassium: 4.6 mEq/L (ref 3.5–5.3)
Sodium: 138 mEq/L (ref 135–145)
Total Protein: 6.7 g/dL (ref 6.0–8.3)

## 2013-04-14 LAB — CBC
HCT: 40.4 % (ref 36.0–46.0)
HEMOGLOBIN: 13.3 g/dL (ref 12.0–15.0)
MCH: 27 pg (ref 26.0–34.0)
MCHC: 32.9 g/dL (ref 30.0–36.0)
MCV: 82.1 fL (ref 78.0–100.0)
Platelets: 208 10*3/uL (ref 150–400)
RBC: 4.92 MIL/uL (ref 3.87–5.11)
RDW: 14.5 % (ref 11.5–15.5)
WBC: 4 10*3/uL (ref 4.0–10.5)

## 2013-04-14 MED ORDER — CARBAMAZEPINE ER 200 MG PO TB12: 200.0000 mg | ORAL_TABLET | Freq: Two times a day (BID) | ORAL | Status: DC

## 2013-04-14 NOTE — Assessment & Plan Note (Addendum)
Stable on tegretol 200 mg BID. Symptom free since 2009. Carbamazepine level checked today to assess for toxicity. CBC,CMET check for routine monitoring of kidneys Liver and blood cells. F/U in 6-12 months or sooner if any concern. I gave her script for her Tegretol with 11 refills.

## 2013-04-14 NOTE — Patient Instructions (Signed)
Hi Nera, it was nice seeing you today, I am glad to know you doing well on your medication, I will have you continue same dose for now. Today we will do blood test for your kidneys and Liver as well as to assess you for anemia, i will call you with test result.

## 2013-04-14 NOTE — Progress Notes (Signed)
Subjective:     Patient ID: Bridget Trujillo, female   DOB: Apr 18, 1970, 43 y.o.   MRN: [id number]  HPI Seizure disorder: Here for follow up and medication refill,her last seizure episode was in 2009, compliant with medication,feels well, here for follow up.  Current Outpatient Prescriptions on File Prior to Visit  Medication Sig Dispense Refill  . carbamazepine (TEGRETOL XR) 200 MG 12 hr tablet Take 1 tablet (200 mg total) by mouth 2 (two) times daily.  60 tablet  0  . cyclobenzaprine (FLEXERIL) 10 MG tablet Take 1 tablet (10 mg total) by mouth 2 (two) times daily as needed for muscle spasms.  20 tablet  0  . levonorgestrel (MIRENA) 20 MCG/24HR IUD 1 each by Intrauterine route once. May 2012      . naproxen (NAPROSYN) 500 MG tablet Take 1 tablet (500 mg total) by mouth 2 (two) times daily.  30 tablet  0   No current facility-administered medications on file prior to visit.   Past Medical History  Diagnosis Date  . Seizure disorder   . Seizures   . Allergy     Review of Systems  Respiratory: Negative.   Cardiovascular: Negative.   Gastrointestinal: Negative.   Genitourinary: Negative.   Neurological: Negative.   All other systems reviewed and are negative.   Filed Vitals:   04/14/13 1005  BP: 122/85  Pulse: 75  Temp: 98.7 F (37.1 C)  TempSrc: Oral  Height: 5\' 5"  (1.651 m)  Weight: 204 lb (92.534 kg)       Objective:   Physical Exam  Nursing note and vitals reviewed. Constitutional: She is oriented to person, place, and time. She appears well-developed. No distress.  Cardiovascular: Normal rate, regular rhythm, normal heart sounds and intact distal pulses.   No murmur heard. Pulmonary/Chest: Effort normal and breath sounds normal. No respiratory distress.  Abdominal: Soft. Bowel sounds are normal. She exhibits no distension and no mass. There is no tenderness.  Neurological: She is alert and oriented to person, place, and time. She has normal reflexes. No cranial nerve  deficit. Coordination normal.       Assessment:     Seizure disorder     Plan:     Check problem list.

## 2013-04-18 LAB — CARBAMAZEPINE, FREE AND TOTAL
CARBAMAZEPINE FREE: 0.8 ug/mL — AB (ref 1.0–3.0)
CARBAMAZEPINE METABOLITE -: 0.9 ug/mL (ref 0.2–2.0)
CARBAMAZEPINE METABOLITE: 0.6 ug/mL (ref 0.1–1.0)
CARBAMAZEPINE, TOTAL: 3.6 ug/mL — AB (ref 4.0–12.0)
Carbamazepine Metabolite/: 0.3 ug/mL
Carbamazepine, Bound: 2.8 ug/mL

## 2013-05-14 ENCOUNTER — Telehealth: Payer: Self-pay | Admitting: Family Medicine

## 2013-05-14 NOTE — Telephone Encounter (Signed)
Will forward to MD. Leoma Folds,CMA  

## 2013-05-14 NOTE — Telephone Encounter (Signed)
Pt called and would like the results of her recent lab work. jw

## 2013-05-14 NOTE — Telephone Encounter (Signed)
Test result discussed with patient all question answered.

## 2013-05-14 NOTE — Telephone Encounter (Signed)
Test result discussed with patient today,all her questions were answered.

## 2013-06-18 ENCOUNTER — Telehealth: Payer: Self-pay | Admitting: Family Medicine

## 2013-06-18 NOTE — Telephone Encounter (Signed)
Pt dropped off form to be fill out regarding [employer].

## 2013-06-19 NOTE — Telephone Encounter (Signed)
Spoke with patient and advised that we do not complete the patient assistance forms here, because then the medications would come to our office and we are responsible for them.  Informed her (per Paulino RilyPete Koval) that the MAP program would be able to assist her with this.  Pt will come to pick up the form.   Dr. Lum BabeEniola,  FYI Dr. Raymondo BandKoval said that you may have to send an RX over to the health department. Gabrielle Wakeland, Maryjo RochesterJessica Dawn

## 2013-12-21 ENCOUNTER — Telehealth: Payer: Self-pay | Admitting: *Deleted

## 2013-12-21 NOTE — Telephone Encounter (Signed)
Patient called to see when her IUD was inserted and when it is due to come out. 5:22 Call to patient- IUD inserted 08/02/2010- due for removal 5 years from insertion. Patient wishes to get it removed and another reinserted. Told patient we could do that at the appropriate time.

## 2014-01-25 ENCOUNTER — Encounter: Payer: Self-pay | Admitting: Family Medicine

## 2014-06-07 ENCOUNTER — Other Ambulatory Visit: Payer: Self-pay | Admitting: Family Medicine

## 2014-06-08 ENCOUNTER — Other Ambulatory Visit (HOSPITAL_COMMUNITY)
Admission: RE | Admit: 2014-06-08 | Discharge: 2014-06-08 | Disposition: A | Discharge status: Home / Self Care | Payer: BLUE CROSS/BLUE SHIELD | Source: Ambulatory Visit | Attending: Family Medicine | Admitting: Family Medicine

## 2014-06-08 DIAGNOSIS — Z01419 Encounter for gynecological examination (general) (routine) without abnormal findings: Secondary | ICD-10-CM | POA: Diagnosis not present

## 2014-06-09 LAB — CYTOLOGY - PAP

## 2015-06-27 ENCOUNTER — Ambulatory Visit (INDEPENDENT_AMBULATORY_CARE_PROVIDER_SITE_OTHER): Payer: BLUE CROSS/BLUE SHIELD | Admitting: Obstetrics

## 2015-06-27 ENCOUNTER — Encounter: Payer: Self-pay | Admitting: Obstetrics

## 2015-06-27 VITALS — BP 141/86 | HR 73 | Temp 99.0°F | Ht 65.0 in | Wt 209.0 lb

## 2015-06-27 DIAGNOSIS — R1011 Right upper quadrant pain: Secondary | ICD-10-CM | POA: Diagnosis not present

## 2015-06-27 DIAGNOSIS — T8332XA Displacement of intrauterine contraceptive device, initial encounter: Secondary | ICD-10-CM

## 2015-06-27 LAB — POCT URINALYSIS DIPSTICK
Blood, UA: NEGATIVE
Glucose, UA: NEGATIVE
Leukocytes, UA: NEGATIVE
Nitrite, UA: NEGATIVE
Protein, UA: NEGATIVE
Spec Grav, UA: 1.015
Urobilinogen, UA: NEGATIVE
pH, UA: 5

## 2015-06-27 MED ORDER — IBUPROFEN 800 MG PO TABS: 800.0000 mg | ORAL_TABLET | Freq: Three times a day (TID) | ORAL | Status: DC | PRN

## 2015-06-27 NOTE — Progress Notes (Signed)
Patient ID: Bridget Trujillo, female   DOB: 1971/01/04, 45 y.o.   MRN: [id number]  Chief Complaint  Patient presents with  . Gynecologic Exam    For 1 1/2 week patient complains of pain uooer R side. Patient has the Mirena- due to come out next month. Patient has light monthly cycles always with the Mirena.    HPI Bridget Trujillo is a 45 y.o. female.  Sharp RUQ pain for past week, mainly at night.  Not associated with meals.  Occurs when lying down at night.  HPI  Past Medical History  Diagnosis Date  . Seizure disorder (HCC)   . Seizures (HCC)   . Allergy     History reviewed. No pertinent past surgical history.  Family History  Problem Relation Age of Onset  . Hypertension Mother   . Hypertension Father   . Hypertension Son   . Stroke Neg Hx   . Heart disease Neg Hx   . Cancer Neg Hx     Social History Social History  Substance Use Topics  . Smoking status: Never Smoker   . Smokeless tobacco: Never Used  . Alcohol Use: No    No Known Allergies  Current Outpatient Prescriptions  Medication Sig Dispense Refill  . carbamazepine (TEGRETOL XR) 200 MG 12 hr tablet Take 1 tablet (200 mg total) by mouth 2 (two) times daily. 60 tablet 11  . levonorgestrel (MIRENA) 20 MCG/24HR IUD 1 each by Intrauterine route once. May 2012    . ibuprofen (ADVIL,MOTRIN) 800 MG tablet Take 1 tablet (800 mg total) by mouth every 8 (eight) hours as needed. 30 tablet 5  . naproxen (NAPROSYN) 500 MG tablet Take 1 tablet (500 mg total) by mouth 2 (two) times daily. (Patient not taking: Reported on 06/27/2015) 30 tablet 0   No current facility-administered medications for this visit.    Review of Systems Review of Systems Constitutional: negative for fatigue and weight loss Respiratory: negative for cough and wheezing Cardiovascular: negative for chest pain, fatigue and palpitations Gastrointestinal: positive for abdominal pain ( RUQ ).  Negative for change in bowel  habits Genitourinary:negative Integument/breast: negative for nipple discharge Musculoskeletal: positive for RUQ pain Neurological: negative for gait problems and tremors Behavioral/Psych: negative for abusive relationship, depression Endocrine: negative for temperature intolerance     Blood pressure 141/86, pulse 73, temperature 99 F (37.2 C), height 5\' 5"  (1.651 m), weight 209 lb (94.802 kg), last menstrual period 06/21/2015.  Physical Exam Physical Exam General:   alert  Skin:   no rash or abnormalities  Lungs:   clear to auscultation bilaterally  Heart:   regular rate and rhythm, S1, S2 normal, no murmur, click, rub or gallop  Breasts:   normal without suspicious masses, skin or nipple changes or axillary nodes  Abdomen:   Positive RUQ costovertebral angle tenderness ( positive Murphy's Sign )  Pelvis:  External genitalia: normal general appearance Urinary system: urethral meatus normal and bladder without fullness, nontender Vaginal: normal without tenderness, induration or masses Cervix: normal appearance.  IUD string not visible. Adnexa: normal bimanual exam Uterus: anteverted and non-tender, normal size      Data Reviewed Labs  Assessment     RUQ pain.  R/O cholecystitis.  IUD placement needed    Plan  Abdominal U/S ordered for possible gallbladder disease  Pelvic U/S ordered for IUD Placement.  Ibuprofen Rx F/U 2 weeks.  Orders Placed This Encounter  Procedures  . US Transvaginal Non-OB    Standing Status: Future  Number of Occurrences:      Standing Expiration Date: 08/26/2016    Order Specific Question:  Reason for Exam (SYMPTOM  OR DIAGNOSIS REQUIRED)    Answer:  IUD placement    Order Specific Question:  Preferred imaging location?    Answer:  [facility]  . US Pelvis Complete    Standing Status: Future     Number of Occurrences:      Standing Expiration Date: 08/26/2016    Order Specific Question:  Reason for Exam (SYMPTOM  OR DIAGNOSIS  REQUIRED)    Answer:  IUD Placement    Order Specific Question:  Preferred imaging location?    Answer:  [facility]  . US Abdomen Complete    Standing Status: Future     Number of Occurrences:      Standing Expiration Date: 08/26/2016    Order Specific Question:  Reason for Exam (SYMPTOM  OR DIAGNOSIS REQUIRED)    Answer:  Right upper quadrant pain    Order Specific Question:  Preferred imaging location?    Answer:  [facility]  . NuSwab Vaginitis Plus (VG+)  . POCT urinalysis dipstick   Meds ordered this encounter  Medications  . ibuprofen (ADVIL,MOTRIN) 800 MG tablet    Sig: Take 1 tablet (800 mg total) by mouth every 8 (eight) hours as needed.    Dispense:  30 tablet    Refill:  5

## 2015-06-29 ENCOUNTER — Other Ambulatory Visit: Payer: Self-pay | Admitting: Obstetrics

## 2015-06-29 ENCOUNTER — Ambulatory Visit (HOSPITAL_COMMUNITY)
Admission: RE | Admit: 2015-06-29 | Discharge: 2015-06-29 | Disposition: A | Discharge status: Home / Self Care | Payer: BLUE CROSS/BLUE SHIELD | Source: Ambulatory Visit | Attending: Obstetrics | Admitting: Obstetrics

## 2015-06-29 DIAGNOSIS — R1011 Right upper quadrant pain: Secondary | ICD-10-CM | POA: Diagnosis present

## 2015-06-29 DIAGNOSIS — K7689 Other specified diseases of liver: Secondary | ICD-10-CM | POA: Diagnosis not present

## 2015-06-29 DIAGNOSIS — A5901 Trichomonal vulvovaginitis: Secondary | ICD-10-CM

## 2015-06-29 MED ORDER — METRONIDAZOLE 500 MG PO TABS: 2000.0000 mg | ORAL_TABLET | Freq: Once | ORAL | Status: DC

## 2015-07-01 LAB — NUSWAB VAGINITIS PLUS (VG+)
ATOPOBIUM VAGINAE: HIGH {score} — AB
BVAB 2: HIGH {score} — AB
CANDIDA ALBICANS, NAA: NEGATIVE
CANDIDA GLABRATA, NAA: NEGATIVE
Chlamydia trachomatis, NAA: NEGATIVE
Megasphaera 1: HIGH Score — AB
Neisseria gonorrhoeae, NAA: NEGATIVE
Trich vag by NAA: NEGATIVE

## 2015-07-02 ENCOUNTER — Other Ambulatory Visit: Payer: Self-pay | Admitting: Obstetrics

## 2015-07-02 DIAGNOSIS — B9689 Other specified bacterial agents as the cause of diseases classified elsewhere: Secondary | ICD-10-CM

## 2015-07-02 DIAGNOSIS — N760 Acute vaginitis: Principal | ICD-10-CM

## 2015-07-02 MED ORDER — METRONIDAZOLE 500 MG PO TABS: 500.0000 mg | ORAL_TABLET | Freq: Two times a day (BID) | ORAL | Status: DC

## 2015-07-06 ENCOUNTER — Ambulatory Visit (HOSPITAL_COMMUNITY)
Admission: RE | Admit: 2015-07-06 | Discharge: 2015-07-06 | Disposition: A | Discharge status: Home / Self Care | Payer: BLUE CROSS/BLUE SHIELD | Source: Ambulatory Visit | Attending: Obstetrics | Admitting: Obstetrics

## 2015-07-06 DIAGNOSIS — T8332XA Displacement of intrauterine contraceptive device, initial encounter: Secondary | ICD-10-CM

## 2015-07-06 DIAGNOSIS — Z30431 Encounter for routine checking of intrauterine contraceptive device: Secondary | ICD-10-CM | POA: Diagnosis not present

## 2015-07-06 DIAGNOSIS — N800 Endometriosis of uterus: Secondary | ICD-10-CM | POA: Insufficient documentation

## 2015-08-02 ENCOUNTER — Encounter: Payer: Self-pay | Admitting: Certified Nurse Midwife

## 2015-08-02 ENCOUNTER — Ambulatory Visit (INDEPENDENT_AMBULATORY_CARE_PROVIDER_SITE_OTHER): Payer: BLUE CROSS/BLUE SHIELD | Admitting: Certified Nurse Midwife

## 2015-08-02 VITALS — BP 131/90 | HR 72 | Wt 207.0 lb

## 2015-08-02 DIAGNOSIS — Z30433 Encounter for removal and reinsertion of intrauterine contraceptive device: Secondary | ICD-10-CM

## 2015-08-02 DIAGNOSIS — Z975 Presence of (intrauterine) contraceptive device: Secondary | ICD-10-CM

## 2015-08-02 DIAGNOSIS — B373 Candidiasis of vulva and vagina: Secondary | ICD-10-CM | POA: Diagnosis not present

## 2015-08-02 DIAGNOSIS — B3731 Acute candidiasis of vulva and vagina: Secondary | ICD-10-CM

## 2015-08-02 LAB — POCT URINALYSIS DIPSTICK
BILIRUBIN UA: NEGATIVE
Blood, UA: NEGATIVE
GLUCOSE UA: NEGATIVE
Ketones, UA: NEGATIVE
Leukocytes, UA: NEGATIVE
NITRITE UA: NEGATIVE
Protein, UA: NEGATIVE
Spec Grav, UA: <=1.005
UROBILINOGEN UA: NEGATIVE
pH, UA: 6

## 2015-08-02 MED ORDER — FLUCONAZOLE 200 MG PO TABS: 200.0000 mg | ORAL_TABLET | Freq: Once | ORAL | Status: DC

## 2015-08-02 MED ORDER — TERCONAZOLE 0.4 % VA CREA: 1.0000 | TOPICAL_CREAM | Freq: Every day | VAGINAL | Status: DC

## 2015-08-02 NOTE — Progress Notes (Signed)
Patient ID: Bridget Trujillo, female   DOB: [birth year], 45 y.o.   MRN: [id number]  IUD Procedure Note   MIRENA REMOVAL   Reasons  for removal:  Time for removal and reinsertion, IUD strings not seen with annual exam.  Is having periods.     A timeout was performed confirming the patient, the procedure and allergy status. The patient was placed in the lithotomy position, a speculum was placed.  Cervix  Visualized, strings not present.  Cervical OS stenotic, dilated easily attemtped cyto brush, attempted IUD hook, not able to grasp IUD strings.  Hurricane spray utilized at cervix.  Dr. Clearance CootsHarper notified, able to get IUD strings with IUD hook after attempting Long forceps.   Long forceps used in a strile manner.  Stings grasped with long forceps and device removed intact.  The patient tolerated the procedure well.  New contraceptive method: Replaced Mirena IUD at patients request.     DIAGNOSIS: Desires long-term, reversible contraception   PROCEDURE: IUD placement Performing Provider: Orvilla Cornwallachelle Aydrien Froman CNM  Patient counseled prior to procedure. I explained risks and benefits of Mirena IUD, reviewed alternative forms of contraception including Lyletta. Patient stated understanding and consented to continue with procedure.   LMP: last month Pregnancy Test: Negative Lot #: [id number] Expiration Date: 01/20   IUD type: [X]  Mirena   [  ] Paraguard  [  ] Christean GriefSkyla   [  ]  Kyleena  PROCEDURE:  Timeout procedure was performed to ensure right patient and right site.  A bimanual exam was performed to determine the position of the uterus, retroverted. The speculum was placed. The vagina and cervix was sterilized in the usual manner and sterile technique was maintained throughout the course of the procedure. A single toothed tenaculum was not required. The depth of the uterus was sounded to 11 cm. The IUD was inserted to the appropriate depth and inserted without difficulty.  The string was cut to an estimated 6 cm  length. Bleeding was minimal. The patient tolerated the procedure well.   Follow up: The patient tolerated the procedure well without complications.  Standard post-procedure care is explained and return precautions are given.  Orvilla Cornwallachelle Zayna Toste CNM

## 2015-08-02 NOTE — Addendum Note (Signed)
Addended by: Marya LandryFOSTER, Calina Patrie D on: 08/02/2015 02:45 PM   Modules accepted: Orders

## 2015-08-05 ENCOUNTER — Other Ambulatory Visit: Payer: Self-pay | Admitting: Certified Nurse Midwife

## 2015-08-05 LAB — NUSWAB VG, CANDIDA 6SP
CANDIDA ALBICANS, NAA: POSITIVE — AB
CANDIDA GLABRATA, NAA: NEGATIVE
CANDIDA LUSITANIAE, NAA: NEGATIVE
CANDIDA PARAPSILOSIS, NAA: NEGATIVE
CANDIDA TROPICALIS, NAA: NEGATIVE
Candida krusei, NAA: NEGATIVE
Trich vag by NAA: NEGATIVE

## 2015-09-02 ENCOUNTER — Ambulatory Visit: Payer: BLUE CROSS/BLUE SHIELD | Admitting: Certified Nurse Midwife

## 2015-09-02 ENCOUNTER — Ambulatory Visit (INDEPENDENT_AMBULATORY_CARE_PROVIDER_SITE_OTHER): Payer: BLUE CROSS/BLUE SHIELD | Admitting: Obstetrics

## 2015-09-02 ENCOUNTER — Encounter: Payer: Self-pay | Admitting: Obstetrics

## 2015-09-02 VITALS — BP 140/92 | HR 77 | Wt 207.0 lb

## 2015-09-02 DIAGNOSIS — N898 Other specified noninflammatory disorders of vagina: Secondary | ICD-10-CM | POA: Diagnosis not present

## 2015-09-02 DIAGNOSIS — Z1389 Encounter for screening for other disorder: Secondary | ICD-10-CM | POA: Diagnosis not present

## 2015-09-02 DIAGNOSIS — Z30431 Encounter for routine checking of intrauterine contraceptive device: Secondary | ICD-10-CM | POA: Diagnosis not present

## 2015-09-02 LAB — POCT URINALYSIS DIPSTICK
Bilirubin, UA: NEGATIVE
GLUCOSE UA: NEGATIVE
Ketones, UA: NEGATIVE
Leukocytes, UA: NEGATIVE
NITRITE UA: NEGATIVE
PROTEIN UA: NEGATIVE
RBC UA: NEGATIVE
Spec Grav, UA: 1.02
UROBILINOGEN UA: NEGATIVE
pH, UA: 5

## 2015-09-06 LAB — NUSWAB VG, CANDIDA 6SP
Atopobium vaginae: HIGH Score — AB
BVAB 2: HIGH Score — AB
CANDIDA KRUSEI, NAA: NEGATIVE
CANDIDA PARAPSILOSIS, NAA: NEGATIVE
CANDIDA TROPICALIS, NAA: NEGATIVE
Candida albicans, NAA: NEGATIVE
Candida glabrata, NAA: NEGATIVE
Candida lusitaniae, NAA: NEGATIVE
Megasphaera 1: HIGH Score — AB
Trich vag by NAA: NEGATIVE

## 2015-09-07 ENCOUNTER — Encounter: Payer: Self-pay | Admitting: Obstetrics

## 2015-09-07 NOTE — Progress Notes (Signed)
Subjective:    Bridget Trujillo is a 45 y.o. female who presents for contraception counseling. The patient has no complaints today. The patient is sexually active. Pertinent past medical history: none.  The information documented in the HPI was reviewed and verified.  Menstrual History: OB History    Gravida Para Term Preterm AB TAB SAB Ectopic Multiple Living   1                No LMP recorded.   Patient Active Problem List   Diagnosis Date Noted  . Encounter for IUD removal and reinsertion 08/02/2015  . Cellulitis 12/06/2011  . 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 ALLERGIC RHINITIS 07/19/2009  . KNEE PAIN, RIGHT 07/16/2008  . LOW BACK PAIN, CHRONIC 09/16/2007  . ROTATOR CUFF SYNDROME, RIGHT 06/03/2007  . HEMORRHOIDS 03/14/2007  . SEIZURE DISORDER, GENERALIZED 03/14/2007   Past Medical History  Diagnosis Date  . Seizure disorder (HCC)   . Seizures (HCC)   . Allergy     History reviewed. No pertinent past surgical history.   Current outpatient prescriptions:  .  carbamazepine (TEGRETOL XR) 200 MG 12 hr tablet, Take 1 tablet (200 mg total) by mouth 2 (two) times daily., Disp: 60 tablet, Rfl: 11 .  ibuprofen (ADVIL,MOTRIN) 800 MG tablet, Take 1 tablet (800 mg total) by mouth every 8 (eight) hours as needed., Disp: 30 tablet, Rfl: 5 .  levonorgestrel (MIRENA) 20 MCG/24HR IUD, 1 each by Intrauterine route once. May 2012, Disp: , Rfl:  No Known Allergies  Social History  Substance Use Topics  . Smoking status: Never Smoker   . Smokeless tobacco: Never Used  . Alcohol Use: No    Family History  Problem Relation Age of Onset  . Hypertension Mother   . Hypertension Father   . Hypertension Son   . Stroke Neg Hx   . Heart disease Neg Hx   . Cancer Neg Hx        Review of Systems Constitutional: negative for weight loss Genitourinary:negative for abnormal menstrual periods and vaginal discharge   Objective:   BP 140/92 mmHg  Pulse 77  Wt 207 lb (93.895 kg)            General:  Alert and no distress Abdomen:  normal findings: no organomegaly, soft, non-tender and no hernia  Pelvis:  External genitalia: normal general appearance Urinary system: urethral meatus normal and bladder without fullness, nontender Vaginal: normal without tenderness, induration or masses Cervix: normal appearance.  IUD string visible and normal length Adnexa: normal bimanual exam Uterus: anteverted and non-tender, normal size   Lab Review Urine pregnancy test Labs reviewed yes Radiologic studies reviewed yes    Assessment:    45 y.o., continuing IUD, no contraindications.   Plan:    All questions answered. Discussed healthy lifestyle modifications. Follow up as needed.  No orders of the defined types were placed in this encounter.   Orders Placed This Encounter  Procedures  . POCT urinalysis dipstick

## 2015-09-12 ENCOUNTER — Other Ambulatory Visit: Payer: Self-pay | Admitting: Obstetrics

## 2015-09-12 DIAGNOSIS — B373 Candidiasis of vulva and vagina: Secondary | ICD-10-CM

## 2015-09-12 DIAGNOSIS — B3731 Acute candidiasis of vulva and vagina: Secondary | ICD-10-CM

## 2015-09-12 DIAGNOSIS — N760 Acute vaginitis: Principal | ICD-10-CM

## 2015-09-12 DIAGNOSIS — B9689 Other specified bacterial agents as the cause of diseases classified elsewhere: Secondary | ICD-10-CM

## 2015-09-12 MED ORDER — TERCONAZOLE 0.4 % VA CREA: 1.0000 | TOPICAL_CREAM | Freq: Every day | VAGINAL | Status: DC

## 2015-09-12 MED ORDER — CLINDAMYCIN HCL 300 MG PO CAPS: 300.0000 mg | ORAL_CAPSULE | Freq: Three times a day (TID) | ORAL | Status: DC

## 2015-09-14 ENCOUNTER — Encounter: Payer: Self-pay | Admitting: *Deleted

## 2015-12-20 ENCOUNTER — Telehealth: Payer: Self-pay | Admitting: *Deleted

## 2015-12-20 NOTE — Telephone Encounter (Signed)
Spoke with pt regarding appt for IUD removal.  Pt states that she would like to attempt to conceive and would like IUD removed.  Pt made aware that she can be scheduled for removal and appt was made.  Pt advised to call office if any change in appt needs.

## 2015-12-20 NOTE — Telephone Encounter (Signed)
Pt called to office stating she recently had her IUD replaced. Pt states that she would now like to have removed. Her questions: Can she make appt for removal or Is there a timeframe that she must wait to have removed?  Attempt to contact pt regarding call.  LM on VM to call office.

## 2016-01-11 ENCOUNTER — Ambulatory Visit (INDEPENDENT_AMBULATORY_CARE_PROVIDER_SITE_OTHER): Payer: BLUE CROSS/BLUE SHIELD | Admitting: Obstetrics

## 2016-01-11 VITALS — BP 133/95 | HR 67 | Temp 98.5°F | Wt 205.1 lb

## 2016-01-11 DIAGNOSIS — Z30432 Encounter for removal of intrauterine contraceptive device: Secondary | ICD-10-CM

## 2016-01-11 DIAGNOSIS — Z3169 Encounter for other general counseling and advice on procreation: Secondary | ICD-10-CM

## 2016-01-11 MED ORDER — PRENATAL PLUS 27-1 MG PO TABS: 1.0000 | ORAL_TABLET | Freq: Every day | ORAL | 11 refills | Status: DC

## 2016-01-12 ENCOUNTER — Encounter: Payer: Self-pay | Admitting: Obstetrics

## 2016-01-12 NOTE — Progress Notes (Signed)
Subjective:    Bridget Trujillo is a 45 y.o. female who presents for removal of IUD. The patient has no complaints today. The patient is sexually active. Pertinent past medical history: none.  The information documented in the HPI was reviewed and verified.  Menstrual History: OB History    Gravida Para Term Preterm AB Living   1             SAB TAB Ectopic Multiple Live Births                   No LMP recorded.   Patient Active Problem List   Diagnosis Date Noted  . Encounter for IUD removal and reinsertion 08/02/2015  . Cellulitis 12/06/2011  . 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 ALLERGIC RHINITIS 07/19/2009  . KNEE PAIN, RIGHT 07/16/2008  . LOW BACK PAIN, CHRONIC 09/16/2007  . ROTATOR CUFF SYNDROME, RIGHT 06/03/2007  . HEMORRHOIDS 03/14/2007  . SEIZURE DISORDER, GENERALIZED 03/14/2007   Past Medical History:  Diagnosis Date  . Allergy   . Seizure disorder (HCC)   . Seizures (HCC)     No past surgical history on file.   Current Outpatient Prescriptions:  .  carbamazepine (TEGRETOL XR) 200 MG 12 hr tablet, Take 1 tablet (200 mg total) by mouth 2 (two) times daily., Disp: 60 tablet, Rfl: 11 .  clindamycin (CLEOCIN) 300 MG capsule, Take 1 capsule (300 mg total) by mouth 3 (three) times daily., Disp: 21 capsule, Rfl: 0 .  ibuprofen (ADVIL,MOTRIN) 800 MG tablet, Take 1 tablet (800 mg total) by mouth every 8 (eight) hours as needed., Disp: 30 tablet, Rfl: 5 .  levonorgestrel (MIRENA) 20 MCG/24HR IUD, 1 each by Intrauterine route once. May 2012, Disp: , Rfl:  .  terconazole (TERAZOL 7) 0.4 % vaginal cream, Place 1 applicator vaginally at bedtime., Disp: 45 g, Rfl: 0 .  prenatal vitamin w/FE, FA (PRENATAL 1 + 1) 27-1 MG TABS tablet, Take 1 tablet by mouth daily at 12 noon., Disp: 30 each, Rfl: 11 No Known Allergies  Social History  Substance Use Topics  . Smoking status: Never Smoker  . Smokeless tobacco: Never Used  . Alcohol use No    Family History  Problem  Relation Age of Onset  . Hypertension Mother   . Hypertension Father   . Hypertension Son   . Stroke Neg Hx   . Heart disease Neg Hx   . Cancer Neg Hx        Review of Systems Constitutional: negative for weight loss Genitourinary:negative for abnormal menstrual periods and vaginal discharge   Objective:   BP (!) 133/95   Pulse 67   Temp 98.5 F (36.9 C) (Oral)   Wt 205 lb 1.6 oz (93 kg)   BMI 34.13 kg/m  Pelvis:  External genitalia: normal general appearance Urinary system: urethral meatus normal and bladder without fullness, nontender Vaginal: normal without tenderness, induration or masses Cervix: normal appearance.  IUD string grasped with long dressing forceps and removed, intact. Adnexa: normal bimanual exam Uterus: anteverted and non-tender, normal size   Lab Review Urine pregnancy test Labs reviewed yes Radiologic studies reviewed no  50% of 20 min visit spent on counseling and coordination of care.   Assessment:    45 y.o., discontinuing IUD, no contraindications.    Patient desires future fertility  Plan:    Contraception: none.    Preconception consultation done.  PNV's Rx. Meds ordered this encounter  Medications  . prenatal vitamin w/FE, FA (PRENATAL  1 + 1) 27-1 MG TABS tablet    Sig: Take 1 tablet by mouth daily at 12 noon.    Dispense:  30 each    Refill:  11   No orders of the defined types were placed in this encounter.

## 2016-05-07 ENCOUNTER — Ambulatory Visit (INDEPENDENT_AMBULATORY_CARE_PROVIDER_SITE_OTHER): Payer: BLUE CROSS/BLUE SHIELD | Admitting: Podiatry

## 2016-05-07 DIAGNOSIS — B079 Viral wart, unspecified: Secondary | ICD-10-CM

## 2016-05-09 NOTE — Progress Notes (Signed)
Subjective:     Patient ID: Bridget Trujillo, female   DOB: 1970/05/07, 46 y.o.   MRN: [id number]  HPI patient presents with painful lesion plantar aspect right foot stating that it's been hard to walk on and it's grown recently   Review of Systems  All other systems reviewed and are negative.      Objective:   Physical Exam  Constitutional: She is oriented to person, place, and time.  Cardiovascular: Intact distal pulses.   Musculoskeletal: Normal range of motion.  Neurological: She is oriented to person, place, and time.  Skin: Skin is warm.  Nursing note and vitals reviewed.  neurovascular status intact muscle strength adequate range of motion within normal limits with patient found to have significant lesion plantar aspect right measuring approximately 8 mm x 1 cm that upon debridement shows pinpoint bleeding pain to lateral pressure. Patient's found have good digital perfusion and is well oriented     Assessment:     Verruca plantaris plantar aspect right    Plan:     H&P condition reviewed deep debridement accomplished and apply chemical agent to create an immune response and explain what to do if any blistering were to occur. Reappoint 4 weeks or earlier if needed

## 2016-06-18 ENCOUNTER — Ambulatory Visit: Payer: BLUE CROSS/BLUE SHIELD | Admitting: Podiatry

## 2017-06-05 ENCOUNTER — Encounter: Payer: Self-pay | Admitting: Obstetrics

## 2017-06-05 ENCOUNTER — Ambulatory Visit (INDEPENDENT_AMBULATORY_CARE_PROVIDER_SITE_OTHER): Payer: BLUE CROSS/BLUE SHIELD | Admitting: Obstetrics

## 2017-06-05 VITALS — BP 143/97 | HR 89 | Wt 210.0 lb

## 2017-06-05 DIAGNOSIS — Z1151 Encounter for screening for human papillomavirus (HPV): Secondary | ICD-10-CM

## 2017-06-05 DIAGNOSIS — J209 Acute bronchitis, unspecified: Secondary | ICD-10-CM

## 2017-06-05 DIAGNOSIS — B3731 Acute candidiasis of vulva and vagina: Secondary | ICD-10-CM

## 2017-06-05 DIAGNOSIS — Z113 Encounter for screening for infections with a predominantly sexual mode of transmission: Secondary | ICD-10-CM | POA: Diagnosis not present

## 2017-06-05 DIAGNOSIS — Z1239 Encounter for other screening for malignant neoplasm of breast: Secondary | ICD-10-CM

## 2017-06-05 DIAGNOSIS — Z124 Encounter for screening for malignant neoplasm of cervix: Secondary | ICD-10-CM

## 2017-06-05 DIAGNOSIS — Z01419 Encounter for gynecological examination (general) (routine) without abnormal findings: Secondary | ICD-10-CM

## 2017-06-05 DIAGNOSIS — B373 Candidiasis of vulva and vagina: Secondary | ICD-10-CM

## 2017-06-05 MED ORDER — FLUCONAZOLE 150 MG PO TABS
150.0000 mg | ORAL_TABLET | Freq: Once | ORAL | 1 refills | Status: AC
Start: 2017-06-05 — End: 2017-06-05

## 2017-06-05 MED ORDER — AMOXICILLIN-POT CLAVULANATE 875-125 MG PO TABS: 1.0000 | ORAL_TABLET | Freq: Two times a day (BID) | ORAL | 1 refills | Status: DC

## 2017-06-05 MED ORDER — PREDNISONE 10 MG PO TABS: ORAL_TABLET | ORAL | 1 refills | Status: DC

## 2017-06-05 NOTE — Progress Notes (Signed)
Subjective:        Bridget Trujillo is a 47 y.o. female here for a routine exam.  Current complaints: None.    Personal health questionnaire:  Is patient Ashkenazi Jewish, have a family history of breast and/or ovarian cancer: no Is there a family history of uterine cancer diagnosed at age < 52, gastrointestinal cancer, urinary tract cancer, family member who is a Personnel officer syndrome-associated carrier: no Is the patient overweight and hypertensive, family history of diabetes, personal history of gestational diabetes, preeclampsia or PCOS: no Is patient over 69, have PCOS,  family history of premature CHD under age 12, diabetes, smoke, have hypertension or peripheral artery disease:  no At any time, has a partner hit, kicked or otherwise hurt or frightened you?: no Over the past 2 weeks, have you felt down, depressed or hopeless?: no Over the past 2 weeks, have you felt little interest or pleasure in doing things?:no   Gynecologic History Patient's last menstrual period was 05/30/2017 (approximate). Contraception: none Last Pap: 2016. Results were: normal Last mammogram: 2013. Results were: normal  Obstetric History OB History  Gravida Para Term Preterm AB Living  2         2  SAB TAB Ectopic Multiple Live Births               # Outcome Date GA Lbr Len/2nd Weight Sex Delivery Anes PTL Lv  2 Gravida           1 Gravida              Birth Comments: System Generated. Please review and update pregnancy details.      Past Medical History:  Diagnosis Date  . Allergy   . Seizure disorder (HCC)   . Seizures (HCC)     History reviewed. No pertinent surgical history.   Current Outpatient Medications:  .  carbamazepine (TEGRETOL XR) 200 MG 12 hr tablet, Take 1 tablet (200 mg total) by mouth 2 (two) times daily., Disp: 60 tablet, Rfl: 11 .  ibuprofen (ADVIL,MOTRIN) 800 MG tablet, Take 1 tablet (800 mg total) by mouth every 8 (eight) hours as needed., Disp: 30 tablet, Rfl: 5 .   amoxicillin-clavulanate (AUGMENTIN) 875-125 MG tablet, Take 1 tablet by mouth 2 (two) times daily., Disp: 20 tablet, Rfl: 1 .  clindamycin (CLEOCIN) 300 MG capsule, Take 1 capsule (300 mg total) by mouth 3 (three) times daily. (Patient not taking: Reported on 06/05/2017), Disp: 21 capsule, Rfl: 0 .  levonorgestrel (MIRENA) 20 MCG/24HR IUD, 1 each by Intrauterine route once. May 2012, Disp: , Rfl:  .  predniSONE (DELTASONE) 10 MG tablet, Taper once a day as directed 6-5-4-3-2-1, Disp: 21 tablet, Rfl: 1 .  prenatal vitamin w/FE, FA (PRENATAL 1 + 1) 27-1 MG TABS tablet, Take 1 tablet by mouth daily at 12 noon. (Patient not taking: Reported on 06/05/2017), Disp: 30 each, Rfl: 11 .  terconazole (TERAZOL 7) 0.4 % vaginal cream, Place 1 applicator vaginally at bedtime. (Patient not taking: Reported on 06/05/2017), Disp: 45 g, Rfl: 0 No Known Allergies  Social History   Tobacco Use  . Smoking status: Never Smoker  . Smokeless tobacco: Never Used  Substance Use Topics  . Alcohol use: No    Alcohol/week: 0.0 oz    Family History  Problem Relation Age of Onset  . Hypertension Mother   . Hypertension Father   . Hypertension Son   . Stroke Neg Hx   . Heart disease Neg Hx   .  Cancer Neg Hx       Review of Systems  Constitutional: negative for fatigue and weight loss Respiratory: negative for cough and wheezing Cardiovascular: negative for chest pain, fatigue and palpitations Gastrointestinal: negative for abdominal pain and change in bowel habits Musculoskeletal:negative for myalgias Neurological: negative for gait problems and tremors Behavioral/Psych: negative for abusive relationship, depression Endocrine: negative for temperature intolerance    Genitourinary:negative for abnormal menstrual periods, genital lesions, hot flashes, sexual problems and vaginal discharge Integument/breast: negative for breast lump, breast tenderness, nipple discharge and skin lesion(s)    Objective:       BP  (!) 143/97   Pulse 89   Wt 210 lb (95.3 kg)   LMP 05/30/2017 (Approximate)   BMI 34.95 kg/m  General:   alert  Skin:   no rash or abnormalities  Lungs:   clear to auscultation bilaterally  Heart:   regular rate and rhythm, S1, S2 normal, no murmur, click, rub or gallop  Breasts:   normal without suspicious masses, skin or nipple changes or axillary nodes  Abdomen:  normal findings: no organomegaly, soft, non-tender and no hernia  Pelvis:  External genitalia: normal general appearance Urinary system: urethral meatus normal and bladder without fullness, nontender Vaginal: normal without tenderness, induration or masses Cervix: normal appearance Adnexa: normal bimanual exam Uterus: anteverted and non-tender, normal size   Lab Review Urine pregnancy test Labs reviewed yes Radiologic studies reviewed yes  50% of 20 min visit spent on counseling and coordination of care.   Assessment:     1. Encounter for routine gynecological examination with Papanicolaou smear of cervix Rx: - Cytology - PAP  2. Screening examination for STD (sexually transmitted disease) Rx: - Cervicovaginal ancillary only - Hepatitis B surface antigen - Hepatitis C antibody - HIV antibody - RPR  3. Screening breast examination Rx: - MM DIAG BREAST TOMO BILATERAL; Future - MM DIGITAL SCREENING BILATERAL; Future  4. Acute bronchitis, unspecified organism Rx: - predniSONE (DELTASONE) 10 MG tablet; Taper once a day as directed 6-5-4-3-2-1  Dispense: 21 tablet; Refill: 1 - amoxicillin-clavulanate (AUGMENTIN) 875-125 MG tablet; Take 1 tablet by mouth 2 (two) times daily.  Dispense: 20 tablet; Refill: 1  5. Candida vaginitis Rx: - fluconazole (DIFLUCAN) 150 MG tablet; Take 1 tablet (150 mg total) by mouth once for 1 dose.  Dispense: 1 tablet; Refill: 1   Plan:    Education reviewed: calcium supplements, depression evaluation, low fat, low cholesterol diet, safe sex/STD prevention, self breast exams and  smoking cessation. Contraception: none. Mammogram ordered. Follow up in: 1 year.    Meds ordered this encounter  Medications  . predniSONE (DELTASONE) 10 MG tablet    Sig: Taper once a day as directed 6-5-4-3-2-1    Dispense:  21 tablet    Refill:  1  . amoxicillin-clavulanate (AUGMENTIN) 875-125 MG tablet    Sig: Take 1 tablet by mouth 2 (two) times daily.    Dispense:  20 tablet    Refill:  1  . fluconazole (DIFLUCAN) 150 MG tablet    Sig: Take 1 tablet (150 mg total) by mouth once for 1 dose.    Dispense:  1 tablet    Refill:  1   Orders Placed This Encounter  Procedures  . MM DIAG BREAST TOMO BILATERAL    Standing Status:   Future    Standing Expiration Date:   08/06/2018    Order Specific Question:   Reason for Exam (SYMPTOM  OR DIAGNOSIS REQUIRED)  Answer:   Screening    Order Specific Question:   Is the patient pregnant?    Answer:   No    Order Specific Question:   Preferred imaging location?    [facility]  . MM DIGITAL SCREENING BILATERAL    Ins-bcbs PF:09/26/2011 @ bcg 2D No problems/ no hx/ no implants or reductions/ no needs/ fwp and pt    Standing Status:   Future    Standing Expiration Date:   08/06/2018    Order Specific Question:   Reason for Exam (SYMPTOM  OR DIAGNOSIS REQUIRED)    Answer:   Screening    Order Specific Question:   Is the patient pregnant?    Answer:   No    Order Specific Question:   Preferred imaging location?    [facility]  . Hepatitis B surface antigen  . Hepatitis C antibody  . HIV antibody  . RPR    Brock BadHARLES A. HARPER MD 06-05-2017

## 2017-06-05 NOTE — Progress Notes (Signed)
LMP: 05/30/2017 Regular Mammogram: Due   Last Pap: 06/07/2014 Colonoscopy: N/A   Contraception: None  PCP:  Dr,Sun  STD Screening:  Full panel  CC: None Needs Mammogram.

## 2017-06-06 ENCOUNTER — Ambulatory Visit: Payer: Self-pay | Admitting: Obstetrics

## 2017-06-06 LAB — CERVICOVAGINAL ANCILLARY ONLY
BACTERIAL VAGINITIS: POSITIVE — AB
Candida vaginitis: POSITIVE — AB
Chlamydia: NEGATIVE
NEISSERIA GONORRHEA: NEGATIVE
Trichomonas: NEGATIVE

## 2017-06-06 LAB — RPR: RPR Ser Ql: NONREACTIVE

## 2017-06-06 LAB — HEPATITIS B SURFACE ANTIGEN: HEP B S AG: NEGATIVE

## 2017-06-06 LAB — HIV ANTIBODY (ROUTINE TESTING W REFLEX): HIV SCREEN 4TH GENERATION: NONREACTIVE

## 2017-06-06 LAB — HEPATITIS C ANTIBODY: Hep C Virus Ab: 2 s/co ratio — ABNORMAL HIGH (ref 0.0–0.9)

## 2017-06-07 ENCOUNTER — Ambulatory Visit
Admission: RE | Admit: 2017-06-07 | Discharge: 2017-06-07 | Disposition: A | Discharge status: Home / Self Care | Payer: BLUE CROSS/BLUE SHIELD | Source: Ambulatory Visit | Attending: Obstetrics | Admitting: Obstetrics

## 2017-06-07 ENCOUNTER — Telehealth: Payer: Self-pay | Admitting: Pediatrics

## 2017-06-07 ENCOUNTER — Other Ambulatory Visit: Payer: Self-pay | Admitting: Obstetrics

## 2017-06-07 DIAGNOSIS — B9689 Other specified bacterial agents as the cause of diseases classified elsewhere: Secondary | ICD-10-CM

## 2017-06-07 DIAGNOSIS — N760 Acute vaginitis: Principal | ICD-10-CM

## 2017-06-07 DIAGNOSIS — B373 Candidiasis of vulva and vagina: Secondary | ICD-10-CM

## 2017-06-07 DIAGNOSIS — Z1239 Encounter for other screening for malignant neoplasm of breast: Secondary | ICD-10-CM

## 2017-06-07 DIAGNOSIS — B3731 Acute candidiasis of vulva and vagina: Secondary | ICD-10-CM

## 2017-06-07 MED ORDER — METRONIDAZOLE 500 MG PO TABS: 500.0000 mg | ORAL_TABLET | Freq: Two times a day (BID) | ORAL | 0 refills | Status: DC

## 2017-06-07 MED ORDER — FLUCONAZOLE 150 MG PO TABS: 150.0000 mg | ORAL_TABLET | Freq: Once | ORAL | 0 refills | Status: AC

## 2017-06-07 NOTE — Telephone Encounter (Signed)
Rx sent to verified pharmacy-see result note.

## 2017-06-07 NOTE — Telephone Encounter (Signed)
-----   Message from Brock Badharles A Harper, MD sent at 06/07/2017  4:46 AM EDT ----- Patient has BV and Yeast. No valid Pharmacy to send Rx Call patient for Pharmacy to receive Rx for Flagyl and Diflucan

## 2017-06-10 LAB — CYTOLOGY - PAP
Diagnosis: NEGATIVE
HPV: NOT DETECTED

## 2017-06-11 ENCOUNTER — Other Ambulatory Visit: Payer: Self-pay | Admitting: Obstetrics

## 2017-06-11 DIAGNOSIS — R768 Other specified abnormal immunological findings in serum: Secondary | ICD-10-CM

## 2017-06-13 ENCOUNTER — Telehealth: Payer: Self-pay

## 2017-06-13 NOTE — Telephone Encounter (Signed)
TC to pt regarding message regarding pap results. All results except for pap results have been released PAP is WNL  Please review/sign so that patient can view result in mychart.

## 2017-12-30 ENCOUNTER — Telehealth: Payer: Self-pay

## 2017-12-30 NOTE — Telephone Encounter (Signed)
Pt called and states since she had her IUD removed a couple years ago she has been experiencing some vaginal dryness. Pt would like to know what she can do about this issue.

## 2017-12-31 NOTE — Telephone Encounter (Signed)
Vagisil may be helpful.  There are other vaginal moisturizers that are OTC.  Bridget Trujillo is Rx and may be helpful if covered by her insurance.

## 2018-02-03 ENCOUNTER — Encounter: Payer: Self-pay | Admitting: Obstetrics

## 2018-02-03 ENCOUNTER — Ambulatory Visit: Payer: BLUE CROSS/BLUE SHIELD | Admitting: Obstetrics

## 2018-02-03 VITALS — BP 158/98 | HR 71 | Ht 66.0 in | Wt 204.6 lb

## 2018-02-03 DIAGNOSIS — R102 Pelvic and perineal pain: Secondary | ICD-10-CM

## 2018-02-03 MED ORDER — IBUPROFEN 800 MG PO TABS: 800.0000 mg | ORAL_TABLET | Freq: Three times a day (TID) | ORAL | 5 refills | Status: DC | PRN

## 2018-02-03 NOTE — Progress Notes (Addendum)
Patient ID: Bridget Trujillo, female   DOB: Aug 15, 1970, 47 y.o.   MRN: [id number]  Chief Complaint  Patient presents with  . Gynecologic Exam    HPI Bridget Trujillo is a 47 y.o. female.  Complains of pelvic pain in LLQ over the past 2 weeks.  Pain is intermittent and mild to moderate.  Denies constipation, diarrhea or vaginal discharge.  Periods are regular and normal. HPI  Past Medical History:  Diagnosis Date  . Allergy   . Seizure disorder (HCC)   . Seizures (HCC)     History reviewed. No pertinent surgical history.  Family History  Problem Relation Age of Onset  . Hypertension Mother   . Hypertension Father   . Hypertension Son   . Stroke Neg Hx   . Heart disease Neg Hx   . Cancer Neg Hx     Social History Social History   Tobacco Use  . Smoking status: Never Smoker  . Smokeless tobacco: Never Used  Substance Use Topics  . Alcohol use: No    Alcohol/week: 0.0 standard drinks  . Drug use: No    No Known Allergies  Current Outpatient Medications  Medication Sig Dispense Refill  . carbamazepine (TEGRETOL XR) 200 MG 12 hr tablet Take 1 tablet (200 mg total) by mouth 2 (two) times daily. 60 tablet 11  . ibuprofen (ADVIL,MOTRIN) 800 MG tablet Take 1 tablet (800 mg total) by mouth every 8 (eight) hours as needed. 30 tablet 5  . prenatal vitamin w/FE, FA (PRENATAL 1 + 1) 27-1 MG TABS tablet Take 1 tablet by mouth daily at 12 noon. 30 each 11   No current facility-administered medications for this visit.     Review of Systems Review of Systems Constitutional: negative for fatigue and weight loss Respiratory: negative for cough and wheezing Cardiovascular: negative for chest pain, fatigue and palpitations Gastrointestinal: negative for abdominal pain and change in bowel habits Genitourinary:positive for pelvic pain Integument/breast: negative for nipple discharge Musculoskeletal:negative for myalgias Neurological: negative for gait problems and tremors Behavioral/Psych:  negative for abusive relationship, depression Endocrine: negative for temperature intolerance      Blood pressure (!) 158/98, pulse 71, height 5\' 6"  (1.676 m), weight 204 lb 9.6 oz (92.8 kg), last menstrual period 01/11/2018.  Physical Exam Physical Exam General:   alert  Skin:   no rash or abnormalities  Lungs:   clear to auscultation bilaterally  Heart:   regular rate and rhythm, S1, S2 normal, no murmur, click, rub or gallop  Breasts:   normal without suspicious masses, skin or nipple changes or axillary nodes  Abdomen:  normal findings: no organomegaly, soft, non-tender and no hernia  Pelvis:  External genitalia: normal general appearance Urinary system: urethral meatus normal and bladder without fullness, nontender Vaginal: normal without tenderness, induration or masses Cervix: normal appearance Adnexa: normal bimanual exam Uterus: anteverted and non-tender, normal size    50% of 20 min visit spent on counseling and coordination of care.   Data Reviewed Labs Blood Pressure  Assessment     1. Pelvic pain Rx: - US PELVIC COMPLETE WITH TRANSVAGINAL; Future - ibuprofen (ADVIL,MOTRIN) 800 MG tablet; Take 1 tablet (800 mg total) by mouth every 8 (eight) hours as needed.  Dispense: 30 tablet; Refill: 5   Plan    Follow up after ultrasound.   Orders Placed This Encounter  Procedures  . US PELVIC COMPLETE WITH TRANSVAGINAL    Standing Status:   Future    Standing Expiration Date:  04/06/2019    Order Specific Question:   Reason for Exam (SYMPTOM  OR DIAGNOSIS REQUIRED)    Answer:   Pelvic pain    Order Specific Question:   Preferred imaging location?    Answer:   [facility]   Meds ordered this encounter  Medications  . ibuprofen (ADVIL,MOTRIN) 800 MG tablet    Sig: Take 1 tablet (800 mg total) by mouth every 8 (eight) hours as needed.    Dispense:  30 tablet    Refill:  5    Brock Bad MD 02-03-2018

## 2018-02-03 NOTE — Progress Notes (Signed)
Pt complains of having left lower abdominal pain. Sx started about 2 weeks ago.

## 2018-02-03 NOTE — Patient Instructions (Signed)
Pelvic Pain, Female Pelvic pain is pain in your lower abdomen, below your belly button and between your hips. The pain may start suddenly (acute), keep coming back (recurring), or last a long time (chronic). Pelvic pain that lasts longer than six months is considered chronic. Pelvic pain may affect your:  Reproductive organs.  Urinary system.  Digestive tract.  Musculoskeletal system.  There are many potential causes of pelvic pain. Sometimes, the pain can be a result of digestive or urinary conditions, strained muscles or ligaments, or even reproductive conditions. Sometimes the cause of pelvic pain is not known. Follow these instructions at home:  Take over-the-counter and prescription medicines only as told by your health care provider.  Rest as told by your health care provider.  Do not have sex it if hurts.  Keep a journal of your pelvic pain. Write down: ? When the pain started. ? Where the pain is located. ? What seems to make the pain better or worse, such as food or your menstrual cycle. ? Any symptoms you have along with the pain.  Keep all follow-up visits as told by your health care provider. This is important. Contact a health care provider if:  Medicine does not help your pain.  Your pain comes back.  You have new symptoms.  You have abnormal vaginal discharge or bleeding, including bleeding after menopause.  You have a fever or chills.  You are constipated.  You have blood in your urine or stool.  You have foul-smelling urine.  You feel weak or lightheaded. Get help right away if:  You have sudden severe pain.  Your pain gets steadily worse.  You have severe pain along with fever, nausea, vomiting, or excessive sweating.  You lose consciousness. This information is not intended to replace advice given to you by your health care provider. Make sure you discuss any questions you have with your health care provider. Document Released: 02/07/2004  Document Revised: 04/06/2015 Document Reviewed: 12/31/2014 Elsevier Interactive Patient Education  2018 [employer]  

## 2018-02-11 ENCOUNTER — Ambulatory Visit (HOSPITAL_COMMUNITY)
Admission: RE | Admit: 2018-02-11 | Discharge: 2018-02-11 | Disposition: A | Discharge status: Home / Self Care | Payer: BLUE CROSS/BLUE SHIELD | Source: Ambulatory Visit | Attending: Obstetrics | Admitting: Obstetrics

## 2018-02-11 DIAGNOSIS — R102 Pelvic and perineal pain: Secondary | ICD-10-CM | POA: Diagnosis not present

## 2018-04-22 ENCOUNTER — Encounter: Payer: Self-pay | Admitting: Neurology

## 2018-04-22 ENCOUNTER — Ambulatory Visit: Payer: BLUE CROSS/BLUE SHIELD | Admitting: Neurology

## 2018-04-22 VITALS — BP 132/90 | HR 72 | Ht 66.0 in | Wt 206.0 lb

## 2018-04-22 DIAGNOSIS — R569 Unspecified convulsions: Secondary | ICD-10-CM | POA: Diagnosis not present

## 2018-04-22 NOTE — Progress Notes (Signed)
PATIENT: Bridget Trujillo DOB: [birth year]  Chief Complaint  Patient presents with  . Seizures    She is here to discuss coming off her seizure medication.  She is currently on Tegretol XR 200mg , one tab BID.  Her last seizure was in 2009.  Marland Kitchen. PCP    Deatra JamesSun, Vyvyan, MD     HISTORICAL  Bridget Trujillo is a 48 year old female, seen in request by her primary care physician Dr. Deatra Jamessun, Vyvyan for evaluation of past medical history of seizure, coming off antiepileptic medications, initial evaluation was on April 22, 2018  I have reviewed and summarized the referring note from the referring physician. She has history of generalized seizure, has been taking carbamazepine XR 200 mg twice a day since 2004  Initial episode happened at age 48 in 2004, no warning signs, was witnessed to have generalized tonic-clonic seizure, followed by postevent confusion, with tongue biting, was treated at local hospital in [place], reported normal MRI of the brain, EEG, EKG, she was initially treated with Dilantin, but " it did not work", was switched to current dose of carbamazepine shortly afterwards.  She had total 4-5 seizure from 2004-2009, no warning signs, short lasting, she has been compliant with her medications, she works full-time at [employer]  Laboratory evaluation seen November 2019, normal CMP creatinine of 0.67,  REVIEW OF SYSTEMS: Full 14 system review of systems performed and notable only for seizure All other review of systems were negative.  ALLERGIES: No Known Allergies  HOME MEDICATIONS: Current Outpatient Medications  Medication Sig Dispense Refill  . carbamazepine (TEGRETOL XR) 200 MG 12 hr tablet Take 1 tablet (200 mg total) by mouth 2 (two) times daily. 60 tablet 11  . ibuprofen (ADVIL,MOTRIN) 800 MG tablet Take 1 tablet (800 mg total) by mouth every 8 (eight) hours as needed. 30 tablet 5   No current facility-administered medications for this visit.     PAST MEDICAL HISTORY: Past  Medical History:  Diagnosis Date  . Seizure disorder (HCC)     PAST SURGICAL HISTORY: History reviewed. No pertinent surgical history.  FAMILY HISTORY: Family History  Problem Relation Age of Onset  . Hypertension Mother   . Hypertension Father   . Hypertension Son   . Stroke Neg Hx   . Heart disease Neg Hx   . Cancer Neg Hx     SOCIAL HISTORY: Social History   Socioeconomic History  . Marital status: Single    Spouse name: Not on file  . Number of children: 2  . Years of education: some college  . Highest education level: High school graduate  Occupational History  . Not on file  Social Needs  . Financial resource strain: Not on file  . Food insecurity:    Worry: Not on file    Inability: Not on file  . Transportation needs:    Medical: Not on file    Non-medical: Not on file  Tobacco Use  . Smoking status: Never Smoker  . Smokeless tobacco: Never Used  Substance and Sexual Activity  . Alcohol use: Yes    Alcohol/week: 0.0 standard drinks    Comment: occasional wine  . Drug use: No  . Sexual activity: Yes    Partners: Male    Birth control/protection: None  Lifestyle  . Physical activity:    Days per week: Not on file    Minutes per session: Not on file  . Stress: Not on file  Relationships  . Social connections:  Talks on phone: Not on file    Gets together: Not on file    Attends religious service: Not on file    Active member of club or organization: Not on file    Attends meetings of clubs or organizations: Not on file    Relationship status: Not on file  . Intimate partner violence:    Fear of current or ex partner: Not on file    Emotionally abused: Not on file    Physically abused: Not on file    Forced sexual activity: Not on file  Other Topics Concern  . Not on file  Social History Narrative   Lives with daughter Colen Darling Inlow: 41 months old.  Also has 53 yo son.  Works at [employer] full time.     Seldom use of caffeine.    Right-handed.     PHYSICAL EXAM   Vitals:   04/22/18 0827  BP: 132/90  Pulse: 72  Weight: 206 lb (93.4 kg)  Height: 5\' 6"  (1.676 m)    Not recorded      Body mass index is 33.25 kg/m.  PHYSICAL EXAMNIATION:  Gen: NAD, conversant, well nourised, obese, well groomed                     Cardiovascular: Regular rate rhythm, no peripheral edema, warm, nontender. Eyes: Conjunctivae clear without exudates or hemorrhage Neck: Supple, no carotid bruits. Pulmonary: Clear to auscultation bilaterally   NEUROLOGICAL EXAM:  MENTAL STATUS: Speech:    Speech is normal; fluent and spontaneous with normal comprehension.  Cognition:     Orientation to time, place and person     Normal recent and remote memory     Normal Attention span and concentration     Normal Language, naming, repeating,spontaneous speech     Fund of knowledge   CRANIAL NERVES: CN II: Visual fields are full to confrontation. Fundoscopic exam is normal with sharp discs and no vascular changes. Pupils are round equal and briskly reactive to light. CN III, IV, VI: extraocular movement are normal. No ptosis. CN V: Facial sensation is intact to pinprick in all 3 divisions bilaterally. Corneal responses are intact.  CN VII: Face is symmetric with normal eye closure and smile. CN VIII: Hearing is normal to rubbing fingers CN IX, X: Palate elevates symmetrically. Phonation is normal. CN XI: Head turning and shoulder shrug are intact CN XII: Tongue is midline with normal movements and no atrophy.  MOTOR: There is no pronator drift of out-stretched arms. Muscle bulk and tone are normal. Muscle strength is normal.  REFLEXES: Reflexes are 2+ and symmetric at the biceps, triceps, knees, and ankles. Plantar responses are flexor.  SENSORY: Intact to light touch, pinprick, positional sensation and vibratory sensation are intact in fingers and toes.  COORDINATION: Rapid alternating movements and fine finger movements are  intact. There is no dysmetria on finger-to-nose and heel-knee-shin.    GAIT/STANCE: Posture is normal. Gait is steady with normal steps, base, arm swing, and turning. Heel and toe walking are normal. Tandem gait is normal.  Romberg is absent.   DIAGNOSTIC DATA (LABS, IMAGING, TESTING) - I reviewed patient records, labs, notes, testing and imaging myself where available.   ASSESSMENT AND PLAN  Bridget Trujillo is a 48 y.o. female   Epilepsy  Suggestive of generalized tonic-clonic seizure by history, last seizure was in 2009  Doing well with current dose of carbamazepine XR 200 mg twice a day  After discussed with patient,  we decided to keep her on current medication, complete evaluation with MRI of the brain with without contrast, EEG,   Levert FeinsteinYijun Christien Berthelot, M.D. Ph.D.  [facility] [address], [address] [place], [place] [address] Ph: [phone] Fax: [phone]  CC: Deatra JamesSun, Vyvyan, MD

## 2018-04-23 ENCOUNTER — Telehealth: Payer: Self-pay | Admitting: Neurology

## 2018-04-23 NOTE — Telephone Encounter (Signed)
BCBS Auth: [id number] (exp. 04/23/18 to 05/22/18) lvm for pt to be aware that i faxed the order to [facility] imaging bc that is what is in network with her insurance. Phone number [phone] & fax # [phone].

## 2018-04-24 ENCOUNTER — Other Ambulatory Visit: Payer: Self-pay | Admitting: Obstetrics

## 2018-04-24 DIAGNOSIS — Z1231 Encounter for screening mammogram for malignant neoplasm of breast: Secondary | ICD-10-CM

## 2018-05-09 ENCOUNTER — Telehealth: Payer: Self-pay | Admitting: Neurology

## 2018-05-09 NOTE — Telephone Encounter (Signed)
Pt is requesting MRI results. She had it at [facility] last week. Please call to advise

## 2018-05-09 NOTE — Telephone Encounter (Signed)
Stanton Kidney- can you call [facility] and get results? I do not see anything in Epic. Thank you

## 2018-05-09 NOTE — Telephone Encounter (Signed)
Received report. Gave to Dr. Terrace Arabia to Review.

## 2018-05-12 NOTE — Telephone Encounter (Signed)
Please call patient, we had her MRI report with without contrast from [facility] imaging center at [place], we only have the report, she should get MRI CD for [place] to review MRI films together at her next visit in April 2020.  Per report, MRI brain showed no abnormal intracranial abnormalities, but there was evidence of marked focal thickening/dehiscence of the bony structure that supporting the brain,

## 2018-05-13 NOTE — Telephone Encounter (Signed)
Spoke to the patient and notified her of the results below.  She will keep her pending EEG and follow up appts.

## 2018-05-19 ENCOUNTER — Ambulatory Visit: Payer: BLUE CROSS/BLUE SHIELD | Admitting: Neurology

## 2018-05-19 ENCOUNTER — Telehealth: Payer: Self-pay | Admitting: *Deleted

## 2018-05-19 DIAGNOSIS — R569 Unspecified convulsions: Secondary | ICD-10-CM | POA: Diagnosis not present

## 2018-05-19 NOTE — Telephone Encounter (Signed)
R/c cd from [facility] pt appt in April. Cd On [employer].

## 2018-05-20 NOTE — Telephone Encounter (Signed)
MRI disc filed for follow up appt on 07/22/2018.

## 2018-05-23 ENCOUNTER — Ambulatory Visit (HOSPITAL_COMMUNITY)
Admission: EM | Admit: 2018-05-23 | Discharge: 2018-05-23 | Disposition: A | Discharge status: Home / Self Care | Payer: BLUE CROSS/BLUE SHIELD | Attending: Physician Assistant | Admitting: Physician Assistant

## 2018-05-23 ENCOUNTER — Encounter (HOSPITAL_COMMUNITY): Payer: Self-pay | Admitting: *Deleted

## 2018-05-23 DIAGNOSIS — R51 Headache: Secondary | ICD-10-CM | POA: Diagnosis not present

## 2018-05-23 DIAGNOSIS — R519 Headache, unspecified: Secondary | ICD-10-CM

## 2018-05-23 MED ORDER — DEXAMETHASONE SODIUM PHOSPHATE 10 MG/ML IJ SOLN
INTRAMUSCULAR | Status: AC
  Filled 2018-05-23: qty 1

## 2018-05-23 MED ORDER — KETOROLAC TROMETHAMINE 30 MG/ML IJ SOLN
30.0000 mg | Freq: Once | INTRAMUSCULAR | Status: AC
  Administered 2018-05-23: 30 mg via INTRAMUSCULAR

## 2018-05-23 MED ORDER — KETOROLAC TROMETHAMINE 30 MG/ML IJ SOLN
INTRAMUSCULAR | Status: AC
  Filled 2018-05-23: qty 1

## 2018-05-23 MED ORDER — METOCLOPRAMIDE HCL 5 MG/ML IJ SOLN
INTRAMUSCULAR | Status: AC
  Filled 2018-05-23: qty 2

## 2018-05-23 MED ORDER — METOCLOPRAMIDE HCL 5 MG/ML IJ SOLN
5.0000 mg | Freq: Once | INTRAMUSCULAR | Status: AC
  Administered 2018-05-23: 5 mg via INTRAMUSCULAR

## 2018-05-23 MED ORDER — DEXAMETHASONE SODIUM PHOSPHATE 10 MG/ML IJ SOLN
10.0000 mg | Freq: Once | INTRAMUSCULAR | Status: AC
  Administered 2018-05-23: 10 mg via INTRAMUSCULAR

## 2018-05-23 NOTE — ED Triage Notes (Signed)
C/o headache x 2 days , c/o blood pressure being up last pm.

## 2018-05-23 NOTE — Discharge Instructions (Signed)
No alarming signs on exam. Toradol, reglan, decadron injection in office today. Continue daily flonase for the next 7-10 days. Keep hydrated, urine should be clear to pale yellow in color. Follow up with PCP for further monitoring of blood pressure. If blood pressure in 190-200s (of top number), with worse headache of your life, vomiting, one sided weakness, chest pain, shortness of breath, seizure, go to the emergency department for further evaluation needed.

## 2018-05-23 NOTE — ED Provider Notes (Signed)
[facility]    CSN: [id number] Arrival date & time: 05/23/18  1104     History   Chief Complaint Chief Complaint  Patient presents with  . Headache    HPI Kanyia Moriarity is a 48 y.o. female.   48 year old female with history of seizures, comes in for 2 day history of headache. She has had bilateral temporal throbbing that is intermittent. States wearing her readers while looking at ipad can make the symptoms worse, ibuprofen relief pain. Denies nausea, vomiting. Denies head injury, loss of consciousness. Denies URI symptoms such as cough, congestion, sore throat. Denies fever, chills, night sweats. Denies weakness, dizziness, seizures. States checked her BP at home, was 130's/90's and worries that her BP is causing headache. Never smoker.      Past Medical History:  Diagnosis Date  . Seizure disorder [facility])     Patient Active Problem List   Diagnosis Date Noted  . Encounter for IUD removal and reinsertion 08/02/2015  . Cellulitis 12/06/2011  . 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 ALLERGIC RHINITIS 07/19/2009  . KNEE PAIN, RIGHT 07/16/2008  . LOW BACK PAIN, CHRONIC 09/16/2007  . ROTATOR CUFF SYNDROME, RIGHT 06/03/2007  . HEMORRHOIDS 03/14/2007  . Seizures (HCC) 03/14/2007    History reviewed. No pertinent surgical history.  OB History    Gravida  2   Para      Term      Preterm      AB      Living  2     SAB      TAB      Ectopic      Multiple      Live Births               Home Medications    Prior to Admission medications   Medication Sig Start Date End Date Taking? Authorizing Provider  carbamazepine (TEGRETOL XR) 200 MG 12 hr tablet Take 1 tablet (200 mg total) by mouth 2 (two) times daily. 04/14/13   Doreene Eland, MD  ibuprofen (ADVIL,MOTRIN) 800 MG tablet Take 1 tablet (800 mg total) by mouth every 8 (eight) hours as needed. 02/03/18   Brock Bad, MD    Family History Family History  Problem Relation  Age of Onset  . Hypertension Mother   . Hypertension Father   . Hypertension Son   . Stroke Neg Hx   . Heart disease Neg Hx   . Cancer Neg Hx     Social History Social History   Tobacco Use  . Smoking status: Never Smoker  . Smokeless tobacco: Never Used  Substance Use Topics  . Alcohol use: Yes    Alcohol/week: 0.0 standard drinks    Comment: occasional wine  . Drug use: No     Allergies   Patient has no known allergies.   Review of Systems Review of Systems  Reason unable to perform ROS: See HPI as above.     Physical Exam Triage Vital Signs ED Triage Vitals [05/23/18 1204]  Enc Vitals Group     BP (!) 152/96     Pulse Rate 80     Resp 18     Temp (!) 97.3 F (36.3 C)     Temp Source Temporal     SpO2 100 %     Weight      Height      Head Circumference      Peak Flow  Pain Score 8     Pain Loc      Pain Edu?      Excl. in GC?    No data found.  Updated Vital Signs BP (!) 152/96 (BP Location: Right Arm)   Pulse 80   Temp (!) 97.3 F (36.3 C) (Temporal)   Resp 18   LMP 05/05/2018 (Exact Date)   SpO2 100%   Physical Exam Constitutional:      General: She is not in acute distress.    Appearance: She is well-developed. She is not ill-appearing, toxic-appearing or diaphoretic.  HENT:     Head: Normocephalic and atraumatic.     Right Ear: Ear canal and external ear normal.     Left Ear: Ear canal and external ear normal.     Ears:     Comments: Left ear with cerumen impaction, TM not visible.  Right TM dull/opaque, no erythema, bulging.     Nose:     Right Sinus: Maxillary sinus tenderness and frontal sinus tenderness present.     Left Sinus: Maxillary sinus tenderness and frontal sinus tenderness present.     Mouth/Throat:     Mouth: Mucous membranes are moist.     Pharynx: Oropharynx is clear. Uvula midline.  Eyes:     Extraocular Movements: Extraocular movements intact.     Conjunctiva/sclera: Conjunctivae normal.     Pupils:  Pupils are equal, round, and reactive to light.  Neck:     Musculoskeletal: Normal range of motion and neck supple.  Cardiovascular:     Rate and Rhythm: Normal rate and regular rhythm.     Heart sounds: Normal heart sounds. No murmur. No friction rub. No gallop.   Pulmonary:     Effort: Pulmonary effort is normal. No accessory muscle usage, prolonged expiration, respiratory distress or retractions.     Breath sounds: Normal breath sounds. No stridor, decreased air movement or transmitted upper airway sounds. No decreased breath sounds, wheezing, rhonchi or rales.  Skin:    General: Skin is warm and dry.  Neurological:     Mental Status: She is alert and oriented to person, place, and time.     GCS: GCS eye subscore is 4. GCS verbal subscore is 5. GCS motor subscore is 6.     Cranial Nerves: Cranial nerves are intact.     Sensory: Sensation is intact.     Motor: Motor function is intact.     Coordination: Coordination is intact.     Gait: Gait is intact.      UC Treatments / Results  Labs (all labs ordered are listed, but only abnormal results are displayed) Labs Reviewed - No data to display  EKG None  Radiology No results found.  Procedures Procedures (including critical care time)  Medications Ordered in UC Medications  metoCLOPramide (REGLAN) injection 5 mg (has no administration in time range)  dexamethasone (DECADRON) injection 10 mg (has no administration in time range)  ketorolac (TORADOL) 30 MG/ML injection 30 mg (has no administration in time range)    Initial Impression / Assessment and Plan / UC Course  I have reviewed the triage vital signs and the nursing notes.  Pertinent labs & imaging results that were available during my care of the patient were reviewed by me and considered in my medical decision making (see chart for details).    No alarming signs on exam. Toradol, reglan, decadron injection in office today. Discussed possible sinus pressure  causing symptoms. Symptomatic treatment discussed. Push  fluids. Return precautions given.  Final Clinical Impressions(s) / UC Diagnoses   Final diagnoses:  Sinus headache    ED Prescriptions    None        Belinda Fisher, PA-C 05/23/18 1328

## 2018-05-30 NOTE — Procedures (Signed)
   HISTORY: 48 year old female, with history of seizure disorder, repeat EEG for decision of potential coming off antiepileptic medications.  TECHNIQUE:  This is a routine 16 channel EEG recording with one channel devoted to a limited EKG recording.  It was performed during wakefulness, drowsiness and asleep.  Hyperventilation and photic stimulation were performed as activating procedures.  There are minimum muscle and movement artifact noted.  Upon maximum arousal, posterior dominant waking rhythm consistent of mildly dysarrhythmic alpha range activity, with frequency of 6-7 Hz. Activities are symmetric over the bilateral posterior derivations and attenuated with eye opening.  Hyperventilation produced mild/moderate buildup with higher amplitude and the slower activities noted.  Photic stimulation did not alter the tracing.  During EEG recording, patient developed drowsiness and entered sleep, sleep EEG demonstrated architecture, there were frontal centrally dominant vertex waves and symmetric sleep spindles noted.  Post hyperventilation, and during sleep, there was protrusion of short lasting dysrhythmic higher amplitude theta range activity, with spike sharp waves,  EKG demonstrate sinus rhythm, with heart rate of 66 bpm.  CONCLUSION: This is an abnormal EEG.  There is evidence of mild background slowing, dysrhythmic activity, there is short protrusion of generalized spike dysrhythmic higher amplitude activities post hyperventilation and during sleep, suggest instability.  But there was no typical epileptiform discharges noted.  Levert Feinstein, M.D. Ph.D.  [facility] [address] [place], [place] [address] Phone: [phone] Fax:      [phone]

## 2018-06-02 ENCOUNTER — Telehealth: Payer: Self-pay | Admitting: Neurology

## 2018-06-02 NOTE — Telephone Encounter (Signed)
Spoke to patient - she is aware of her EEG results and will keep her pending appt in April.

## 2018-06-02 NOTE — Telephone Encounter (Signed)
Please call patient, EEG continue show mild abnormalities, she should continue her seizure medications, keep follow-up appointment in April

## 2018-06-09 ENCOUNTER — Other Ambulatory Visit: Payer: Self-pay

## 2018-06-09 ENCOUNTER — Ambulatory Visit
Admission: RE | Admit: 2018-06-09 | Discharge: 2018-06-09 | Disposition: A | Discharge status: Home / Self Care | Payer: BLUE CROSS/BLUE SHIELD | Source: Ambulatory Visit | Attending: Obstetrics | Admitting: Obstetrics

## 2018-06-09 DIAGNOSIS — Z1231 Encounter for screening mammogram for malignant neoplasm of breast: Secondary | ICD-10-CM

## 2018-07-16 ENCOUNTER — Encounter: Payer: Self-pay | Admitting: Neurology

## 2018-07-16 ENCOUNTER — Other Ambulatory Visit: Payer: Self-pay | Admitting: *Deleted

## 2018-07-16 ENCOUNTER — Ambulatory Visit (INDEPENDENT_AMBULATORY_CARE_PROVIDER_SITE_OTHER): Payer: BLUE CROSS/BLUE SHIELD | Admitting: Neurology

## 2018-07-16 ENCOUNTER — Other Ambulatory Visit: Payer: Self-pay

## 2018-07-16 DIAGNOSIS — R569 Unspecified convulsions: Secondary | ICD-10-CM | POA: Diagnosis not present

## 2018-07-16 MED ORDER — CARBAMAZEPINE ER 200 MG PO TB12: 200.0000 mg | ORAL_TABLET | Freq: Two times a day (BID) | ORAL | 4 refills | Status: DC

## 2018-07-16 NOTE — Progress Notes (Signed)
    PATIENT: Bridget Trujillo DOB: 02-13-1971  No chief complaint on file.    HISTORICAL  Bridget Trujillo is a 48 year old female, seen in request by her primary care physician Dr. Deatra James for evaluation of past medical history of seizure, coming off antiepileptic medications, initial evaluation was on April 22, 2018  I have reviewed and summarized the referring note from the referring physician. She has history of generalized seizure, has been taking carbamazepine XR 200 mg twice a day since 2004  Initial episode happened at age 75 in 2004, no warning signs, was witnessed to have generalized tonic-clonic seizure, followed by postevent confusion, with tongue biting, was treated at local hospital in [place], reported normal MRI of the brain, EEG, EKG, she was initially treated with Dilantin, but " it did not work", was switched to current dose of carbamazepine shortly afterwards.  She had total 4-5 seizure from 2004-2009, no warning signs, short lasting, she has been compliant with her medications, she works full-time at [employer] evaluation seen November 2019, normal CMP creatinine of 0.67,  Virtual Visit via Video  I connected with Bridget Trujillo on 07/16/18 at  by Video and verified that I am speaking with the correct person using two identifiers.   I discussed the limitations, risks, security and privacy concerns of performing an evaluation and management service by Video and the availability of in person appointments. I also discussed with the patient that there may be a patient responsible charge related to this service. The patient expressed understanding and agreed to proceed.   History of Present Illness: She had MRI of the brain with without contrast on May 06, 2018, no acute intracranial abnormality, there is marked focal thinning/dehiscence of the floor of the mid-cranial fossa bilaterally, without definite adjacent parenchyma abnormality,  EEG in February 2020  showed evidence of mild background slowing, dysrhythmic activity, also short protrusion of generalized spiky dysrhythmic higher amplitude activity post hyperventilation during sleep, suggested instability.     Observations/Objective: I have reviewed problem lists, medications, allergies.  Awake alert oriented to history taking care of conversation  Assessment and Plan: Epilepsy  History is suggestive of generalized tonic-clonic seizure by history, last seizure was in 2009  Doing well with current dose of carbamazepine XR 200 mg twice a day  Abnormal EEG,  After discussed with patient, we decided to keep on current dose of carbamazepine XR 200 mg twice a day, also advised her to take vitamin D plus calcium supplement  Follow Up Instructions:  In 1 year    I discussed the assessment and treatment plan with the patient. The patient was provided an opportunity to ask questions and all were answered. The patient agreed with the plan and demonstrated an understanding of the instructions.   The patient was advised to call back or seek an in-person evaluation if the symptoms worsen or if the condition fails to improve as anticipated.  I provided 15 minutes of non-face-to-face time during this encounter.   Levert Feinstein, MD

## 2018-07-22 ENCOUNTER — Ambulatory Visit: Payer: BLUE CROSS/BLUE SHIELD | Admitting: Neurology

## 2018-10-13 ENCOUNTER — Telehealth: Payer: BLUE CROSS/BLUE SHIELD | Admitting: Obstetrics

## 2019-04-13 ENCOUNTER — Other Ambulatory Visit: Payer: Self-pay | Admitting: Obstetrics

## 2019-04-13 DIAGNOSIS — R102 Pelvic and perineal pain: Secondary | ICD-10-CM

## 2019-05-12 ENCOUNTER — Other Ambulatory Visit: Payer: Self-pay | Admitting: Family Medicine

## 2019-05-12 DIAGNOSIS — Z1231 Encounter for screening mammogram for malignant neoplasm of breast: Secondary | ICD-10-CM

## 2019-06-22 ENCOUNTER — Other Ambulatory Visit: Payer: Self-pay

## 2019-06-22 ENCOUNTER — Ambulatory Visit: Payer: BLUE CROSS/BLUE SHIELD

## 2019-06-25 ENCOUNTER — Ambulatory Visit: Payer: BLUE CROSS/BLUE SHIELD | Attending: Internal Medicine

## 2019-06-25 DIAGNOSIS — Z23 Encounter for immunization: Secondary | ICD-10-CM

## 2019-06-25 NOTE — Progress Notes (Signed)
   Covid-19 Vaccination Clinic  Name:  Bridget Trujillo    MRN: [id number] DOB: 1971-01-14  06/25/2019  [name] was observed post Covid-19 immunization for 15 minutes without incident. She was provided with Vaccine Information Sheet and instruction to access the V-Safe system.   [name] was instructed to call 911 with any severe reactions post vaccine: Marland Kitchen Difficulty breathing  . Swelling of face and throat  . A fast heartbeat  . A bad rash all over body  . Dizziness and weakness   Immunizations Administered    Name Date Dose VIS Date Route   Pfizer COVID-19 Vaccine 06/25/2019  1:48 PM 0.3 mL 03/06/2019 Intramuscular   Manufacturer: [employer], [employer]   Lot: [id number]   NDC: [address]-3

## 2019-07-07 ENCOUNTER — Telehealth: Payer: Self-pay | Admitting: Neurology

## 2019-07-07 NOTE — Telephone Encounter (Signed)
Pt stopped by today wondering if her new insurance would cover future visits with [place]. Pt informed that her insurance was non-participating with [facility] and she would not be covered here. Pt would like call back from the nurse regarding what she should do next concerning a new neurologist. Please advise.

## 2019-07-07 NOTE — Telephone Encounter (Signed)
I called the patient and left her a message. She will need to contact her insurance company to ask them which neurologists are in her network. She may need to be followed by her PCP in the meantime. Her PCP should be able to provide a new referral to the desired neurology office.

## 2019-07-20 ENCOUNTER — Ambulatory Visit: Payer: BLUE CROSS/BLUE SHIELD | Attending: Internal Medicine

## 2019-07-20 DIAGNOSIS — Z23 Encounter for immunization: Secondary | ICD-10-CM

## 2019-07-20 NOTE — Progress Notes (Signed)
   Covid-19 Vaccination Clinic  Name:  Bridget Trujillo    MRN: [id number] DOB: 1970-10-20  07/20/2019  [name] was observed post Covid-19 immunization for 15 minutes without incident. She was provided with Vaccine Information Sheet and instruction to access the V-Safe system.   [name] was instructed to call 911 with any severe reactions post vaccine: Marland Kitchen Difficulty breathing  . Swelling of face and throat  . A fast heartbeat  . A bad rash all over body  . Dizziness and weakness   Immunizations Administered    Name Date Dose VIS Date Route   Pfizer COVID-19 Vaccine 07/20/2019 10:03 AM 0.3 mL 05/20/2018 Intramuscular   Manufacturer: [employer], [employer]   Lot: [id number]   NDC: [address]-8

## 2019-08-25 ENCOUNTER — Ambulatory Visit: Payer: BLUE CROSS/BLUE SHIELD | Admitting: Neurology

## 2019-10-01 ENCOUNTER — Other Ambulatory Visit: Payer: Self-pay | Admitting: Neurology

## 2019-12-26 ENCOUNTER — Other Ambulatory Visit: Payer: Self-pay | Admitting: Neurology

## 2020-03-23 ENCOUNTER — Other Ambulatory Visit: Payer: Self-pay | Admitting: Neurology

## 2020-04-03 IMAGING — US US PELVIS COMPLETE TRANSABD/TRANSVAG
1 series · 15 of 25 positions shown · non-contrast
Comparison: None

CLINICAL DATA: Pelvic pain, LEFT lower quadrant pain for 3 weeks,
G2P2

EXAM:
TRANSABDOMINAL AND TRANSVAGINAL ULTRASOUND OF PELVIS
TECHNIQUE: Both transabdominal and transvaginal ultrasound examinations of the
pelvis were performed. Transabdominal technique was performed for
global imaging of the pelvis including uterus, ovaries, adnexal
regions, and pelvic cul-de-sac. It was necessary to proceed with
endovaginal exam following the transabdominal exam to visualize the
LEFT ovary.

[Series 1: us pelvis complete transabd/transvag · 15 of 99 slices shown]
[im 1/99]
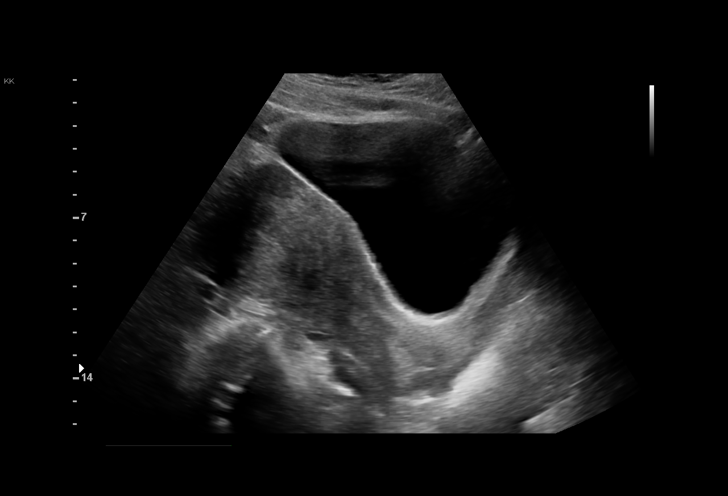
[im 9/99]
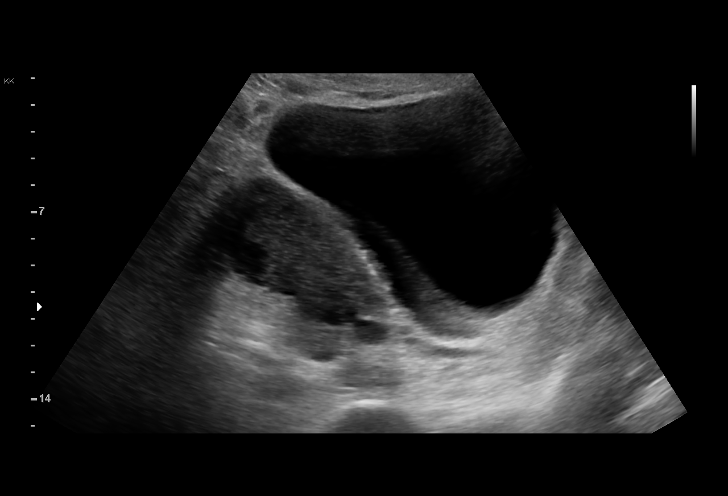
[im 17/99]
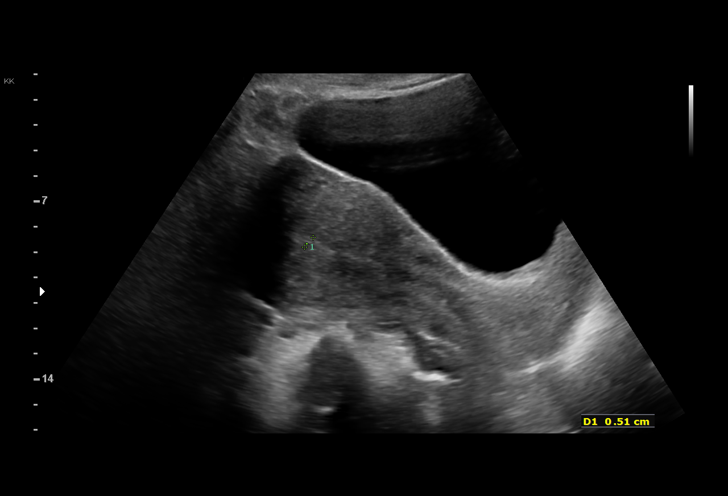
[im 21/99]
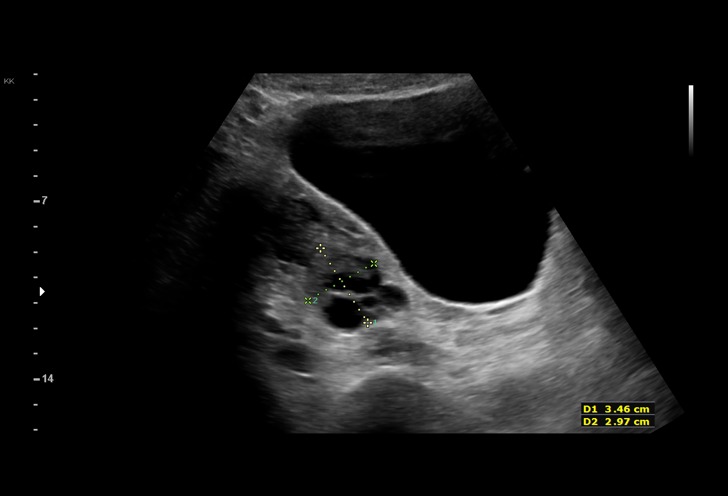
[im 29/99]
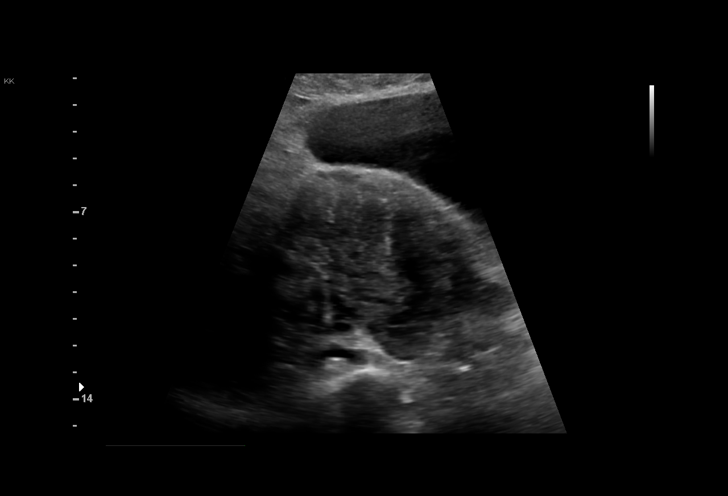
[im 37/99]
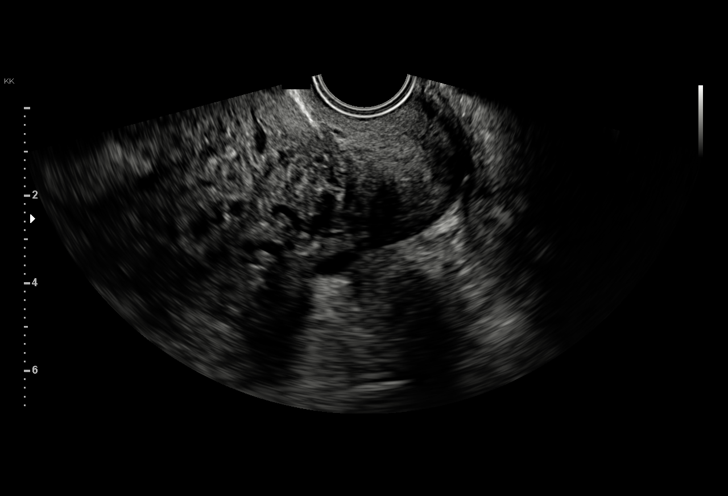
[im 41/99]
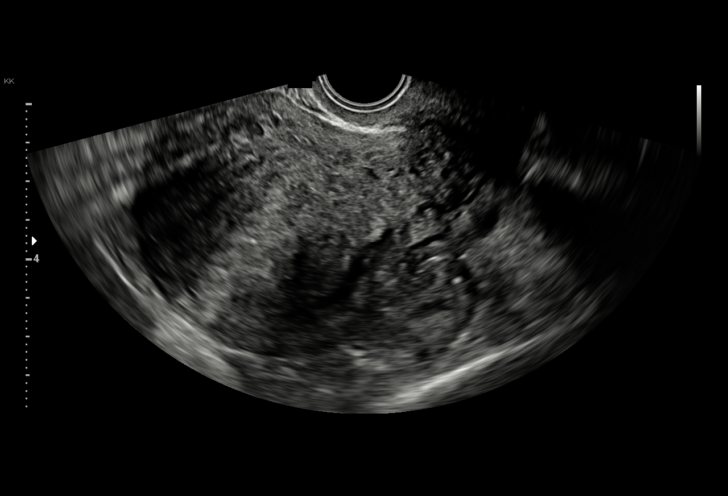
[im 50/99]
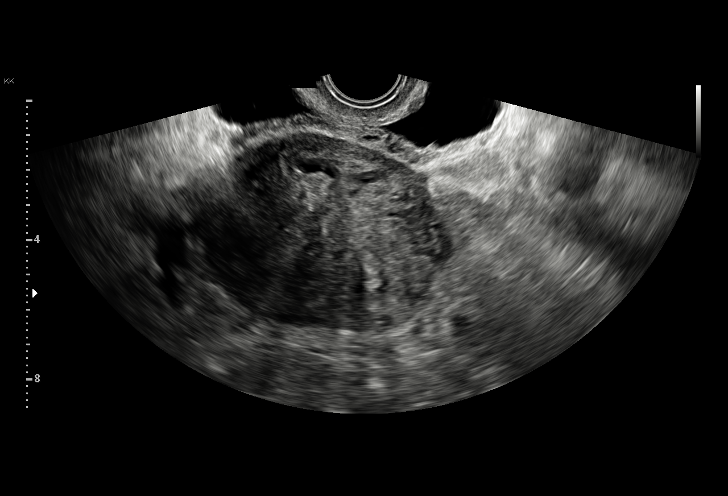
[im 58/99]
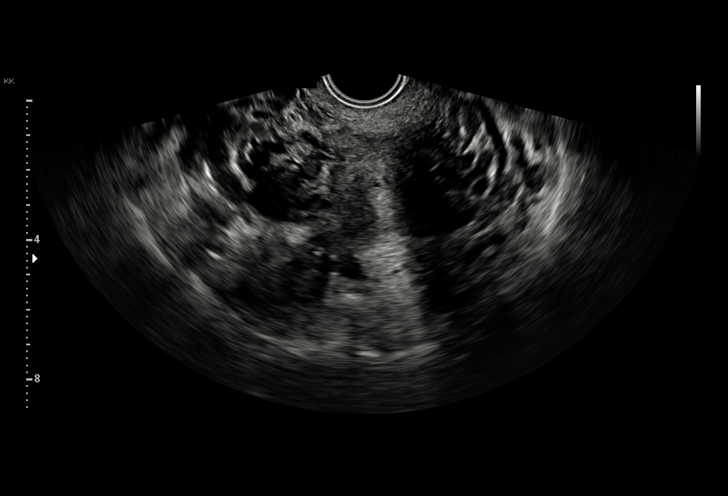
[im 62/99]
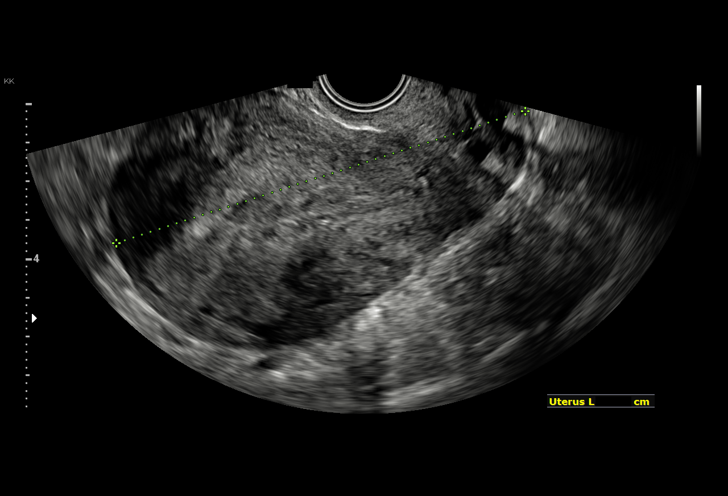
[im 70/99]
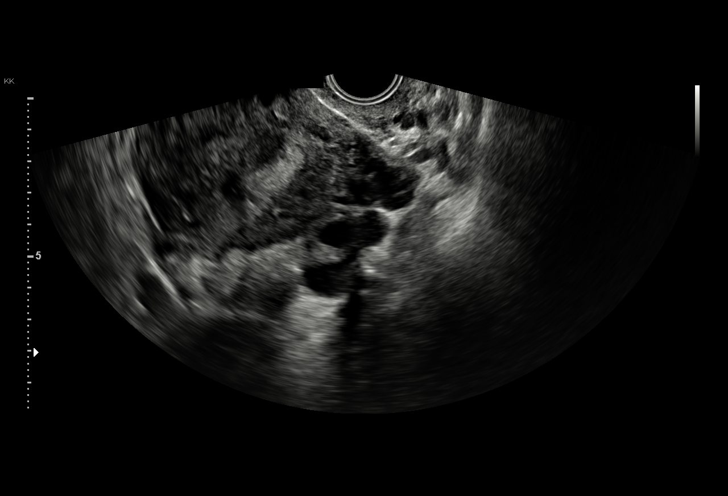
[im 78/99]
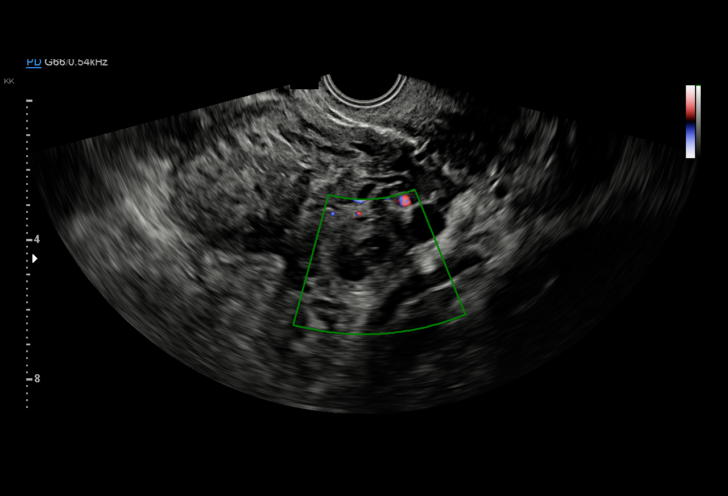
[im 82/99]
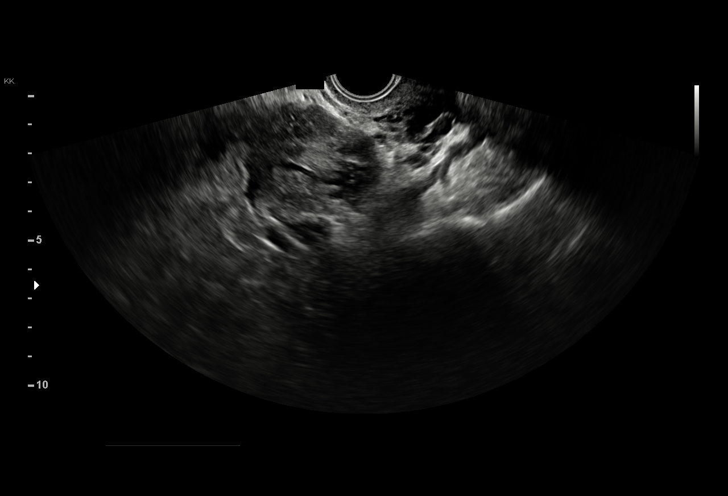
[im 90/99]
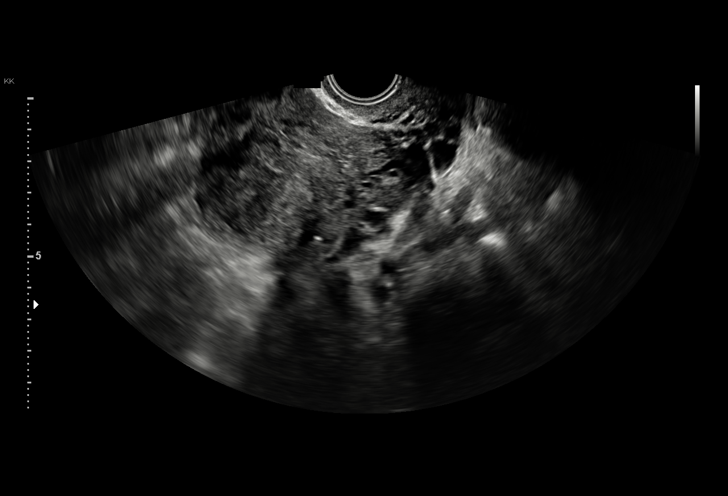
[im 99/99]
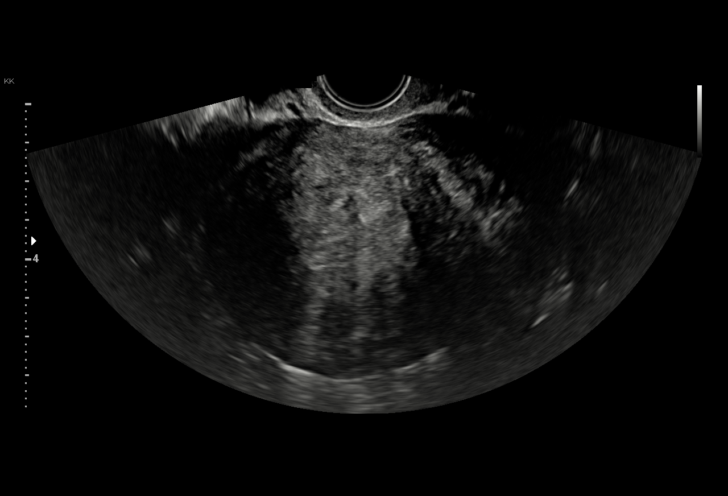

[15 of 25 positions shown; findings below may reference images not displayed]

FINDINGS: Uterus

Measurements: 11.1 x 6.6 x 8.1 cm = volume: 311.1 mL. Mildly
heterogeneous myometrial echogenicity without focal mass lesion

Endometrium

Thickness: Poorly defined endometrial borders, question 3 mm thick.
The ill definition of the endometrial complex is nonspecific, cannot
exclude adenomyosis. No endometrial fluid or focal endometrial mass
identified.

Right ovary

Measurements: 3.4 x 2.0 x 2.9 cm = volume: 10.2 mL. Normal
morphology without mass

Left ovary

Measurements: 4.7 x 3.0 x 2.6 cm = volume: 19.2 mL. Small corpus
luteum. No additional mass.

Other findings

No free pelvic fluid or adnexal masses.
IMPRESSION: Heterogeneous myometrium with ill definition of the endometrial
complex, nonspecific but cannot exclude adenomyosis.

No focal uterine mass lesion or endometrial fluid identified.

Unremarkable ovaries.

## 2020-06-22 ENCOUNTER — Other Ambulatory Visit: Payer: Self-pay | Admitting: Obstetrics

## 2020-06-22 DIAGNOSIS — R102 Pelvic and perineal pain: Secondary | ICD-10-CM

## 2021-05-22 ENCOUNTER — Other Ambulatory Visit: Payer: Self-pay

## 2021-05-22 ENCOUNTER — Ambulatory Visit
Admission: EM | Admit: 2021-05-22 | Discharge: 2021-05-22 | Disposition: A | Discharge status: Home / Self Care | Payer: BLUE CROSS/BLUE SHIELD

## 2022-05-01 ENCOUNTER — Other Ambulatory Visit: Payer: Self-pay | Admitting: Neurology

## 2024-04-15 ENCOUNTER — Encounter (HOSPITAL_COMMUNITY): Payer: Self-pay | Admitting: Emergency Medicine

## 2024-04-15 ENCOUNTER — Other Ambulatory Visit: Payer: Self-pay

## 2024-04-15 ENCOUNTER — Emergency Department (HOSPITAL_COMMUNITY)
Admission: EM | Admit: 2024-04-15 | Discharge: 2024-04-15 | Disposition: A | Discharge status: Home / Self Care | Attending: Emergency Medicine | Admitting: Emergency Medicine

## 2024-04-15 DIAGNOSIS — W228XXA Striking against or struck by other objects, initial encounter: Secondary | ICD-10-CM | POA: Insufficient documentation

## 2024-04-15 DIAGNOSIS — R102 Pelvic and perineal pain unspecified side: Secondary | ICD-10-CM

## 2024-04-15 DIAGNOSIS — S0990XA Unspecified injury of head, initial encounter: Secondary | ICD-10-CM | POA: Diagnosis present

## 2024-04-15 MED ORDER — IBUPROFEN 600 MG PO TABS: 600.0000 mg | ORAL_TABLET | Freq: Four times a day (QID) | ORAL | 0 refills | Status: AC | PRN

## 2024-04-15 NOTE — ED Provider Notes (Signed)
" °  [place] [facility] AT [facility] Provider Note   CSN: [id number] Arrival date & time: 04/15/24  1246     Patient presents with: Head Injury   Bridget Trujillo is a 54 y.o. female.   HPI 54 year old female presents today complaining of head injury after striking head on counter last night after washing her hair.  She did not lose consciousness.  She is not on blood thinners.  She has taken ibuprofen  without relief.  She was unable to go to work today due to feeling lightheaded.    Prior to Admission medications  Medication Sig Start Date End Date Taking? Authorizing Provider  carbamazepine  (TEGRETOL  XR) 200 MG 12 hr tablet TAKE 1 TABLET BY MOUTH TWICE A DAY *NEED YEARLY FOLLOW-UP* 12/28/19   Onita Duos, MD  ibuprofen  (ADVIL ) 800 MG tablet TAKE 1 TABLET BY MOUTH EVERY 8 HOURS AS NEEDED 06/22/20   Rudy Carlin LABOR, MD    Allergies: Patient has no known allergies.    Review of Systems  Updated Vital Signs BP (!) 169/98 (BP Location: Left Arm)   Pulse 82   Temp 98.1 F (36.7 C) (Oral)   Resp 16   SpO2 99%   Physical Exam Vitals reviewed.  Constitutional:      Appearance: Normal appearance.  HENT:     Head: Normocephalic and atraumatic.     Right Ear: External ear normal.     Left Ear: External ear normal.     Nose: Nose normal.     Mouth/Throat:     Pharynx: Oropharynx is clear.  Eyes:     Pupils: Pupils are equal, round, and reactive to light.  Cardiovascular:     Rate and Rhythm: Normal rate and regular rhythm.     Pulses: Normal pulses.  Pulmonary:     Effort: Pulmonary effort is normal.  Musculoskeletal:        General: Normal range of motion.     Cervical back: Normal range of motion.  Skin:    General: Skin is warm.     Capillary Refill: Capillary refill takes less than 2 seconds.  Neurological:     General: No focal deficit present.     Mental Status: She is alert.  Psychiatric:        Mood and Affect: Mood normal.     (all labs  ordered are listed, but only abnormal results are displayed) Labs Reviewed - No data to display  EKG: None  Radiology: No results found.   Procedures   Medications Ordered in the ED - No data to display                                  Medical Decision Making Amount and/or Complexity of Data Reviewed Radiology: ordered.   Patient with normal neurological exam.  She had a low-energy injury to her forehead.  She does feel somewhat lightheaded.  Her blood pressure is high and she has known high blood pressure.  Discussed need for follow-up, return precautions and she voices understanding.   Patient took a day off work secondary to not feeling well.  Given a work note to go back on Saturday    Final diagnoses:  Minor head injury, initial encounter    ED Discharge Orders     None          Levander Houston, MD 04/15/24 1436  "

## 2024-04-15 NOTE — Discharge Instructions (Signed)
 Please follow-up with your doctor next week if any continued symptoms.  Please recheck your blood pressure with your doctor. Return if you are having any new or worsening symptoms

## 2024-04-15 NOTE — ED Triage Notes (Addendum)
 While washing her hair, she hit her head on the sink. She is not on a blood thinner, nor did she lose consciousness. Now complains of dizziness so severe she couldn't go to work. Dizziness worsens with laying down. She also has intermittent blurry vision. Her MD told her to come here. Denies pain or mental changes.    HX: seizures
